# Patient Record
Sex: Male | Born: 1960
Health system: Southern US, Community
[De-identification: ages and names within clinical notes are randomized; demographics above are authoritative.]

## PROBLEM LIST (undated history)

## (undated) DIAGNOSIS — I10 Essential (primary) hypertension: Secondary | ICD-10-CM

## (undated) DIAGNOSIS — E079 Disorder of thyroid, unspecified: Secondary | ICD-10-CM

## (undated) DIAGNOSIS — N400 Enlarged prostate without lower urinary tract symptoms: Secondary | ICD-10-CM

## (undated) DIAGNOSIS — F329 Major depressive disorder, single episode, unspecified: Secondary | ICD-10-CM

## (undated) DIAGNOSIS — F32A Depression, unspecified: Secondary | ICD-10-CM

## (undated) DIAGNOSIS — E785 Hyperlipidemia, unspecified: Secondary | ICD-10-CM

## (undated) DIAGNOSIS — K7460 Unspecified cirrhosis of liver: Secondary | ICD-10-CM

## (undated) DIAGNOSIS — G4733 Obstructive sleep apnea (adult) (pediatric): Secondary | ICD-10-CM

## (undated) HISTORY — PX: LUMBAR LAMINECTOMY: SHX95

## (undated) HISTORY — PX: CERVICAL FUSION: SHX112

## (undated) HISTORY — PX: CYSTOSCOPY WITH INSERTION OF UROLIFT: SHX6678

## (undated) HISTORY — PX: OTHER SURGICAL HISTORY: SHX169

## (undated) HISTORY — DX: Depression, unspecified: F32.A

## (undated) HISTORY — DX: Obstructive sleep apnea (adult) (pediatric): G47.33

## (undated) HISTORY — DX: Benign prostatic hyperplasia without lower urinary tract symptoms: N40.0

## (undated) HISTORY — DX: Hyperlipidemia, unspecified: E78.5

## (undated) HISTORY — DX: Major depressive disorder, single episode, unspecified: F32.9

## (undated) HISTORY — DX: Disorder of thyroid, unspecified: E07.9

## (undated) HISTORY — DX: Essential (primary) hypertension: I10

## (undated) HISTORY — DX: Unspecified cirrhosis of liver: K74.60

---

## 2017-07-24 ENCOUNTER — Encounter: Payer: Self-pay | Admitting: *Deleted

## 2017-07-24 DIAGNOSIS — F329 Major depressive disorder, single episode, unspecified: Secondary | ICD-10-CM | POA: Insufficient documentation

## 2017-07-24 DIAGNOSIS — I10 Essential (primary) hypertension: Secondary | ICD-10-CM | POA: Insufficient documentation

## 2017-07-24 DIAGNOSIS — K7460 Unspecified cirrhosis of liver: Secondary | ICD-10-CM | POA: Insufficient documentation

## 2017-07-24 DIAGNOSIS — G4733 Obstructive sleep apnea (adult) (pediatric): Secondary | ICD-10-CM | POA: Insufficient documentation

## 2017-07-24 DIAGNOSIS — E785 Hyperlipidemia, unspecified: Secondary | ICD-10-CM | POA: Insufficient documentation

## 2017-07-24 DIAGNOSIS — N400 Enlarged prostate without lower urinary tract symptoms: Secondary | ICD-10-CM | POA: Insufficient documentation

## 2017-07-24 DIAGNOSIS — F32A Depression, unspecified: Secondary | ICD-10-CM | POA: Insufficient documentation

## 2017-07-25 ENCOUNTER — Encounter: Payer: Self-pay | Admitting: Surgery

## 2017-07-25 ENCOUNTER — Encounter: Payer: Self-pay | Admitting: *Deleted

## 2017-07-25 ENCOUNTER — Institutional Professional Consult (permissible substitution) (INDEPENDENT_AMBULATORY_CARE_PROVIDER_SITE_OTHER): Payer: Medicare HMO | Admitting: Surgery

## 2017-07-25 VITALS — BP 126/83 | HR 57 | Ht 69.0 in | Wt 246.0 lb

## 2017-07-25 DIAGNOSIS — I712 Thoracic aortic aneurysm, without rupture, unspecified: Secondary | ICD-10-CM | POA: Insufficient documentation

## 2017-07-27 ENCOUNTER — Encounter: Payer: Self-pay | Admitting: Surgery

## 2017-07-27 NOTE — Progress Notes (Signed)
Cardiothoracic Surgery Consultation   PCP is Slatosky, Marshall Cork., MD Referring Provider is Enid Skeens., MD  Chief Complaint  Patient presents with  . Thoracic Aortic Aneurysm        HPI:  The patient is a 56 year old gentleman with hypertension and hyperlipidemia, OSA, and cirrhosis of the liver who had a CT scan of the abdomen at [facility] on 06/18/2017 reportedly for evaluation of his liver. This showed an incidental 4.2 cm fusiform ascending aortic aneurysm.  Past Medical History:  Diagnosis Date  . BPH (benign prostatic hyperplasia)   . Cirrhosis of liver ([place])   . Depression   . Hyperlipidemia   . Hypertension   . OSA (obstructive sleep apnea)   . Thyroid disease    hypothyroidism    Past Surgical History:  Procedure Laterality Date  . arthroscopy left knee    . cataract phaco bilaterally    . CERVICAL FUSION     x 2  . CYSTOSCOPY WITH INSERTION OF UROLIFT    . LUMBAR LAMINECTOMY     with hardware    Family History  Problem Relation Age of Onset  . Cancer Father        LIVER and LUNG  . Hyperlipidemia Father   . Thyroid disease Father   . Alzheimer's disease Paternal Grandfather     Social History Social History  Substance Use Topics  . Smoking status: Never Smoker  . Smokeless tobacco: Never Used  . Alcohol use No     Comment: HX OF ETOH ABUSE    Current Outpatient Prescriptions  Medication Sig Dispense Refill  . amLODipine-benazepril (LOTREL) 5-20 MG capsule daily.    Marland Kitchen doxazosin (CARDURA) 8 MG tablet daily.    Marland Kitchen levothyroxine (SYNTHROID, LEVOTHROID) 112 MCG tablet Take 112 mcg by mouth daily.  1  . lovastatin (MEVACOR) 20 MG tablet daily.    Marland Kitchen valproic acid (DEPAKENE) 250 MG capsule Take 250 mg by mouth 4 (four) times daily.    . Venlafaxine HCl 150 MG TB24 daily.     No current facility-administered medications for this visit.     No Known Allergies  Review of Systems  Constitutional: Positive for fatigue.  HENT:    Dentures  Eyes: Negative.   Respiratory: Positive for apnea and shortness of breath.        Uses CPAP  Cardiovascular: Positive for chest pain and leg swelling.  Gastrointestinal: Positive for constipation.  Genitourinary:       BPH  Musculoskeletal: Positive for arthralgias, gait problem, joint swelling and myalgias.  Skin: Positive for rash.  Neurological: Positive for tremors and headaches.       Chronic pain, memory problems.  Hematological: Negative.   Psychiatric/Behavioral: The patient is nervous/anxious.        Depression    BP 126/83   Pulse (!) 57   Ht 5\' 9"  (1.753 m)   Wt 246 lb (111.6 kg)   SpO2 96%   BMI 36.33 kg/m  Physical Exam  Constitutional: He is oriented to person, place, and time. He appears well-developed and well-nourished. No distress.  HENT:  Head: Normocephalic and atraumatic.  Mouth/Throat: Oropharynx is clear and moist.  Eyes: Pupils are equal, round, and reactive to light. EOM are normal.  Neck: Normal range of motion. Neck supple. No JVD present.  Cardiovascular: Normal rate, regular rhythm, normal heart sounds and intact distal pulses.   No murmur heard. Pulmonary/Chest: Effort normal and breath sounds normal. No respiratory  distress.  Abdominal: Soft. Bowel sounds are normal. He exhibits no distension and no mass. There is no tenderness.  Musculoskeletal: Normal range of motion. He exhibits no edema.  Lymphadenopathy:    He has no cervical adenopathy.  Neurological: He is alert and oriented to person, place, and time. He has normal strength. No cranial nerve deficit or sensory deficit.  Skin: Skin is warm and dry.  Psychiatric: He has a normal mood and affect.     Diagnostic Tests:  CT scan of the abdomen from [facility] on 06/18/2017 reviewed on PACS.  Impression:  He has a 4.2 cm fusiform ascending aortic aneurysm that was noted incidentally on this CT scan. There is no sign of aortic dissection. The scan was of the abdomen  but visualized the proximal ascending aorta and mid to distal descending aorta. The aortic arch was not visible. This does not require any treatment at this time but he should have a follow up CTA of the chest in one year. I reviewed the CT images with him and his wife and answered their questions. I stressed the importance of good blood pressure control. The only activity I cautioned him against was heavy lifting of approximately 30 lbs or more which can cause sudden severe high blood pressure in some individuals.   Plan:  I will see him back in one year with a CTA of the chest.   I spent 30 minutes performing this consultation and > 50% of this time was spent face to face counseling and coordinating the care of this patient's aortic aneurysm.   Gaye Pollack, MD [facility] [phone]

## 2018-06-10 DIAGNOSIS — Z01818 Encounter for other preprocedural examination: Secondary | ICD-10-CM

## 2018-07-03 ENCOUNTER — Other Ambulatory Visit: Payer: Self-pay | Admitting: Surgery

## 2018-07-03 DIAGNOSIS — I712 Thoracic aortic aneurysm, without rupture, unspecified: Secondary | ICD-10-CM

## 2018-07-31 ENCOUNTER — Other Ambulatory Visit: Payer: Medicare HMO

## 2018-07-31 ENCOUNTER — Ambulatory Visit: Payer: Medicare HMO | Admitting: Surgery

## 2018-10-25 DIAGNOSIS — M50322 Other cervical disc degeneration at C5-C6 level: Secondary | ICD-10-CM | POA: Diagnosis not present

## 2018-10-25 DIAGNOSIS — S29019A Strain of muscle and tendon of unspecified wall of thorax, initial encounter: Secondary | ICD-10-CM | POA: Diagnosis not present

## 2018-10-25 DIAGNOSIS — M9902 Segmental and somatic dysfunction of thoracic region: Secondary | ICD-10-CM | POA: Diagnosis not present

## 2018-10-25 DIAGNOSIS — M4003 Postural kyphosis, cervicothoracic region: Secondary | ICD-10-CM | POA: Diagnosis not present

## 2018-10-25 DIAGNOSIS — M9901 Segmental and somatic dysfunction of cervical region: Secondary | ICD-10-CM | POA: Diagnosis not present

## 2018-10-25 DIAGNOSIS — M545 Low back pain: Secondary | ICD-10-CM | POA: Diagnosis not present

## 2018-10-25 DIAGNOSIS — M5386 Other specified dorsopathies, lumbar region: Secondary | ICD-10-CM | POA: Diagnosis not present

## 2018-10-25 DIAGNOSIS — M9903 Segmental and somatic dysfunction of lumbar region: Secondary | ICD-10-CM | POA: Diagnosis not present

## 2018-10-25 DIAGNOSIS — M546 Pain in thoracic spine: Secondary | ICD-10-CM | POA: Diagnosis not present

## 2018-10-25 DIAGNOSIS — M542 Cervicalgia: Secondary | ICD-10-CM | POA: Diagnosis not present

## 2018-10-30 DIAGNOSIS — M546 Pain in thoracic spine: Secondary | ICD-10-CM | POA: Diagnosis not present

## 2018-10-30 DIAGNOSIS — M545 Low back pain: Secondary | ICD-10-CM | POA: Diagnosis not present

## 2018-10-30 DIAGNOSIS — M4003 Postural kyphosis, cervicothoracic region: Secondary | ICD-10-CM | POA: Diagnosis not present

## 2018-10-30 DIAGNOSIS — M542 Cervicalgia: Secondary | ICD-10-CM | POA: Diagnosis not present

## 2018-10-30 DIAGNOSIS — M9903 Segmental and somatic dysfunction of lumbar region: Secondary | ICD-10-CM | POA: Diagnosis not present

## 2018-10-30 DIAGNOSIS — M9901 Segmental and somatic dysfunction of cervical region: Secondary | ICD-10-CM | POA: Diagnosis not present

## 2018-10-30 DIAGNOSIS — M50322 Other cervical disc degeneration at C5-C6 level: Secondary | ICD-10-CM | POA: Diagnosis not present

## 2018-10-30 DIAGNOSIS — M5386 Other specified dorsopathies, lumbar region: Secondary | ICD-10-CM | POA: Diagnosis not present

## 2018-10-30 DIAGNOSIS — M9902 Segmental and somatic dysfunction of thoracic region: Secondary | ICD-10-CM | POA: Diagnosis not present

## 2018-10-30 DIAGNOSIS — S29019A Strain of muscle and tendon of unspecified wall of thorax, initial encounter: Secondary | ICD-10-CM | POA: Diagnosis not present

## 2018-11-01 DIAGNOSIS — M50322 Other cervical disc degeneration at C5-C6 level: Secondary | ICD-10-CM | POA: Diagnosis not present

## 2018-11-01 DIAGNOSIS — M546 Pain in thoracic spine: Secondary | ICD-10-CM | POA: Diagnosis not present

## 2018-11-01 DIAGNOSIS — M545 Low back pain: Secondary | ICD-10-CM | POA: Diagnosis not present

## 2018-11-01 DIAGNOSIS — M4003 Postural kyphosis, cervicothoracic region: Secondary | ICD-10-CM | POA: Diagnosis not present

## 2018-11-01 DIAGNOSIS — M9903 Segmental and somatic dysfunction of lumbar region: Secondary | ICD-10-CM | POA: Diagnosis not present

## 2018-11-01 DIAGNOSIS — M9901 Segmental and somatic dysfunction of cervical region: Secondary | ICD-10-CM | POA: Diagnosis not present

## 2018-11-01 DIAGNOSIS — M5386 Other specified dorsopathies, lumbar region: Secondary | ICD-10-CM | POA: Diagnosis not present

## 2018-11-01 DIAGNOSIS — M542 Cervicalgia: Secondary | ICD-10-CM | POA: Diagnosis not present

## 2018-11-01 DIAGNOSIS — M9902 Segmental and somatic dysfunction of thoracic region: Secondary | ICD-10-CM | POA: Diagnosis not present

## 2018-11-01 DIAGNOSIS — S29019A Strain of muscle and tendon of unspecified wall of thorax, initial encounter: Secondary | ICD-10-CM | POA: Diagnosis not present

## 2018-11-06 DIAGNOSIS — S29019A Strain of muscle and tendon of unspecified wall of thorax, initial encounter: Secondary | ICD-10-CM | POA: Diagnosis not present

## 2018-11-06 DIAGNOSIS — M546 Pain in thoracic spine: Secondary | ICD-10-CM | POA: Diagnosis not present

## 2018-11-06 DIAGNOSIS — M5386 Other specified dorsopathies, lumbar region: Secondary | ICD-10-CM | POA: Diagnosis not present

## 2018-11-06 DIAGNOSIS — M9903 Segmental and somatic dysfunction of lumbar region: Secondary | ICD-10-CM | POA: Diagnosis not present

## 2018-11-06 DIAGNOSIS — M9901 Segmental and somatic dysfunction of cervical region: Secondary | ICD-10-CM | POA: Diagnosis not present

## 2018-11-06 DIAGNOSIS — M542 Cervicalgia: Secondary | ICD-10-CM | POA: Diagnosis not present

## 2018-11-06 DIAGNOSIS — M545 Low back pain: Secondary | ICD-10-CM | POA: Diagnosis not present

## 2018-11-06 DIAGNOSIS — M9902 Segmental and somatic dysfunction of thoracic region: Secondary | ICD-10-CM | POA: Diagnosis not present

## 2018-11-06 DIAGNOSIS — M50322 Other cervical disc degeneration at C5-C6 level: Secondary | ICD-10-CM | POA: Diagnosis not present

## 2018-11-06 DIAGNOSIS — M4003 Postural kyphosis, cervicothoracic region: Secondary | ICD-10-CM | POA: Diagnosis not present

## 2018-11-08 DIAGNOSIS — M546 Pain in thoracic spine: Secondary | ICD-10-CM | POA: Diagnosis not present

## 2018-11-08 DIAGNOSIS — M9903 Segmental and somatic dysfunction of lumbar region: Secondary | ICD-10-CM | POA: Diagnosis not present

## 2018-11-08 DIAGNOSIS — M4003 Postural kyphosis, cervicothoracic region: Secondary | ICD-10-CM | POA: Diagnosis not present

## 2018-11-08 DIAGNOSIS — M9902 Segmental and somatic dysfunction of thoracic region: Secondary | ICD-10-CM | POA: Diagnosis not present

## 2018-11-08 DIAGNOSIS — M9901 Segmental and somatic dysfunction of cervical region: Secondary | ICD-10-CM | POA: Diagnosis not present

## 2018-11-08 DIAGNOSIS — M50322 Other cervical disc degeneration at C5-C6 level: Secondary | ICD-10-CM | POA: Diagnosis not present

## 2018-11-08 DIAGNOSIS — M5386 Other specified dorsopathies, lumbar region: Secondary | ICD-10-CM | POA: Diagnosis not present

## 2018-11-08 DIAGNOSIS — M545 Low back pain: Secondary | ICD-10-CM | POA: Diagnosis not present

## 2018-11-08 DIAGNOSIS — S29019A Strain of muscle and tendon of unspecified wall of thorax, initial encounter: Secondary | ICD-10-CM | POA: Diagnosis not present

## 2018-11-08 DIAGNOSIS — M542 Cervicalgia: Secondary | ICD-10-CM | POA: Diagnosis not present

## 2018-11-13 DIAGNOSIS — M542 Cervicalgia: Secondary | ICD-10-CM | POA: Diagnosis not present

## 2018-11-13 DIAGNOSIS — M50322 Other cervical disc degeneration at C5-C6 level: Secondary | ICD-10-CM | POA: Diagnosis not present

## 2018-11-13 DIAGNOSIS — M9901 Segmental and somatic dysfunction of cervical region: Secondary | ICD-10-CM | POA: Diagnosis not present

## 2018-11-13 DIAGNOSIS — M545 Low back pain: Secondary | ICD-10-CM | POA: Diagnosis not present

## 2018-11-13 DIAGNOSIS — M4003 Postural kyphosis, cervicothoracic region: Secondary | ICD-10-CM | POA: Diagnosis not present

## 2018-11-13 DIAGNOSIS — M9903 Segmental and somatic dysfunction of lumbar region: Secondary | ICD-10-CM | POA: Diagnosis not present

## 2018-11-13 DIAGNOSIS — M546 Pain in thoracic spine: Secondary | ICD-10-CM | POA: Diagnosis not present

## 2018-11-13 DIAGNOSIS — M5386 Other specified dorsopathies, lumbar region: Secondary | ICD-10-CM | POA: Diagnosis not present

## 2018-11-13 DIAGNOSIS — M9902 Segmental and somatic dysfunction of thoracic region: Secondary | ICD-10-CM | POA: Diagnosis not present

## 2018-11-13 DIAGNOSIS — S29019A Strain of muscle and tendon of unspecified wall of thorax, initial encounter: Secondary | ICD-10-CM | POA: Diagnosis not present

## 2018-11-20 DIAGNOSIS — M9901 Segmental and somatic dysfunction of cervical region: Secondary | ICD-10-CM | POA: Diagnosis not present

## 2018-11-20 DIAGNOSIS — M542 Cervicalgia: Secondary | ICD-10-CM | POA: Diagnosis not present

## 2018-11-20 DIAGNOSIS — M50322 Other cervical disc degeneration at C5-C6 level: Secondary | ICD-10-CM | POA: Diagnosis not present

## 2018-11-20 DIAGNOSIS — M4003 Postural kyphosis, cervicothoracic region: Secondary | ICD-10-CM | POA: Diagnosis not present

## 2018-11-20 DIAGNOSIS — S29019A Strain of muscle and tendon of unspecified wall of thorax, initial encounter: Secondary | ICD-10-CM | POA: Diagnosis not present

## 2018-11-20 DIAGNOSIS — M9903 Segmental and somatic dysfunction of lumbar region: Secondary | ICD-10-CM | POA: Diagnosis not present

## 2018-11-20 DIAGNOSIS — M9902 Segmental and somatic dysfunction of thoracic region: Secondary | ICD-10-CM | POA: Diagnosis not present

## 2018-11-20 DIAGNOSIS — M5386 Other specified dorsopathies, lumbar region: Secondary | ICD-10-CM | POA: Diagnosis not present

## 2018-11-20 DIAGNOSIS — M546 Pain in thoracic spine: Secondary | ICD-10-CM | POA: Diagnosis not present

## 2018-11-20 DIAGNOSIS — M545 Low back pain: Secondary | ICD-10-CM | POA: Diagnosis not present

## 2018-11-26 DIAGNOSIS — L219 Seborrheic dermatitis, unspecified: Secondary | ICD-10-CM | POA: Diagnosis not present

## 2018-11-26 DIAGNOSIS — L570 Actinic keratosis: Secondary | ICD-10-CM | POA: Diagnosis not present

## 2018-11-28 DIAGNOSIS — Z96652 Presence of left artificial knee joint: Secondary | ICD-10-CM | POA: Diagnosis not present

## 2018-11-28 DIAGNOSIS — M1712 Unilateral primary osteoarthritis, left knee: Secondary | ICD-10-CM | POA: Diagnosis not present

## 2018-12-05 DIAGNOSIS — S29019A Strain of muscle and tendon of unspecified wall of thorax, initial encounter: Secondary | ICD-10-CM | POA: Diagnosis not present

## 2018-12-05 DIAGNOSIS — M9901 Segmental and somatic dysfunction of cervical region: Secondary | ICD-10-CM | POA: Diagnosis not present

## 2018-12-05 DIAGNOSIS — M546 Pain in thoracic spine: Secondary | ICD-10-CM | POA: Diagnosis not present

## 2018-12-05 DIAGNOSIS — M50322 Other cervical disc degeneration at C5-C6 level: Secondary | ICD-10-CM | POA: Diagnosis not present

## 2018-12-05 DIAGNOSIS — M545 Low back pain: Secondary | ICD-10-CM | POA: Diagnosis not present

## 2018-12-05 DIAGNOSIS — M4003 Postural kyphosis, cervicothoracic region: Secondary | ICD-10-CM | POA: Diagnosis not present

## 2018-12-05 DIAGNOSIS — M9903 Segmental and somatic dysfunction of lumbar region: Secondary | ICD-10-CM | POA: Diagnosis not present

## 2018-12-05 DIAGNOSIS — M9902 Segmental and somatic dysfunction of thoracic region: Secondary | ICD-10-CM | POA: Diagnosis not present

## 2018-12-05 DIAGNOSIS — M542 Cervicalgia: Secondary | ICD-10-CM | POA: Diagnosis not present

## 2018-12-05 DIAGNOSIS — M5386 Other specified dorsopathies, lumbar region: Secondary | ICD-10-CM | POA: Diagnosis not present

## 2018-12-11 DIAGNOSIS — E038 Other specified hypothyroidism: Secondary | ICD-10-CM | POA: Diagnosis not present

## 2018-12-19 DIAGNOSIS — M542 Cervicalgia: Secondary | ICD-10-CM | POA: Diagnosis not present

## 2018-12-19 DIAGNOSIS — M9901 Segmental and somatic dysfunction of cervical region: Secondary | ICD-10-CM | POA: Diagnosis not present

## 2018-12-19 DIAGNOSIS — M9902 Segmental and somatic dysfunction of thoracic region: Secondary | ICD-10-CM | POA: Diagnosis not present

## 2018-12-19 DIAGNOSIS — M5386 Other specified dorsopathies, lumbar region: Secondary | ICD-10-CM | POA: Diagnosis not present

## 2018-12-19 DIAGNOSIS — M4003 Postural kyphosis, cervicothoracic region: Secondary | ICD-10-CM | POA: Diagnosis not present

## 2018-12-19 DIAGNOSIS — M9903 Segmental and somatic dysfunction of lumbar region: Secondary | ICD-10-CM | POA: Diagnosis not present

## 2018-12-19 DIAGNOSIS — S29019A Strain of muscle and tendon of unspecified wall of thorax, initial encounter: Secondary | ICD-10-CM | POA: Diagnosis not present

## 2018-12-19 DIAGNOSIS — M546 Pain in thoracic spine: Secondary | ICD-10-CM | POA: Diagnosis not present

## 2018-12-19 DIAGNOSIS — M545 Low back pain: Secondary | ICD-10-CM | POA: Diagnosis not present

## 2018-12-19 DIAGNOSIS — M50322 Other cervical disc degeneration at C5-C6 level: Secondary | ICD-10-CM | POA: Diagnosis not present

## 2018-12-30 DIAGNOSIS — R69 Illness, unspecified: Secondary | ICD-10-CM | POA: Diagnosis not present

## 2019-01-02 DIAGNOSIS — M545 Low back pain: Secondary | ICD-10-CM | POA: Diagnosis not present

## 2019-01-02 DIAGNOSIS — M542 Cervicalgia: Secondary | ICD-10-CM | POA: Diagnosis not present

## 2019-01-02 DIAGNOSIS — M546 Pain in thoracic spine: Secondary | ICD-10-CM | POA: Diagnosis not present

## 2019-01-02 DIAGNOSIS — M9901 Segmental and somatic dysfunction of cervical region: Secondary | ICD-10-CM | POA: Diagnosis not present

## 2019-01-02 DIAGNOSIS — M9903 Segmental and somatic dysfunction of lumbar region: Secondary | ICD-10-CM | POA: Diagnosis not present

## 2019-01-02 DIAGNOSIS — M4003 Postural kyphosis, cervicothoracic region: Secondary | ICD-10-CM | POA: Diagnosis not present

## 2019-01-02 DIAGNOSIS — S29019A Strain of muscle and tendon of unspecified wall of thorax, initial encounter: Secondary | ICD-10-CM | POA: Diagnosis not present

## 2019-01-02 DIAGNOSIS — M5386 Other specified dorsopathies, lumbar region: Secondary | ICD-10-CM | POA: Diagnosis not present

## 2019-01-02 DIAGNOSIS — M50322 Other cervical disc degeneration at C5-C6 level: Secondary | ICD-10-CM | POA: Diagnosis not present

## 2019-01-02 DIAGNOSIS — M9902 Segmental and somatic dysfunction of thoracic region: Secondary | ICD-10-CM | POA: Diagnosis not present

## 2019-01-06 DIAGNOSIS — G4733 Obstructive sleep apnea (adult) (pediatric): Secondary | ICD-10-CM | POA: Diagnosis not present

## 2019-01-06 DIAGNOSIS — M542 Cervicalgia: Secondary | ICD-10-CM | POA: Diagnosis not present

## 2019-01-06 DIAGNOSIS — M47816 Spondylosis without myelopathy or radiculopathy, lumbar region: Secondary | ICD-10-CM | POA: Diagnosis not present

## 2019-01-06 DIAGNOSIS — M545 Low back pain: Secondary | ICD-10-CM | POA: Diagnosis not present

## 2019-01-06 DIAGNOSIS — M5106 Intervertebral disc disorders with myelopathy, lumbar region: Secondary | ICD-10-CM | POA: Diagnosis not present

## 2019-01-06 DIAGNOSIS — M4322 Fusion of spine, cervical region: Secondary | ICD-10-CM | POA: Diagnosis not present

## 2019-01-06 DIAGNOSIS — M791 Myalgia, unspecified site: Secondary | ICD-10-CM | POA: Diagnosis not present

## 2019-01-15 DIAGNOSIS — M50322 Other cervical disc degeneration at C5-C6 level: Secondary | ICD-10-CM | POA: Diagnosis not present

## 2019-01-15 DIAGNOSIS — M4003 Postural kyphosis, cervicothoracic region: Secondary | ICD-10-CM | POA: Diagnosis not present

## 2019-01-15 DIAGNOSIS — M9904 Segmental and somatic dysfunction of sacral region: Secondary | ICD-10-CM | POA: Diagnosis not present

## 2019-01-15 DIAGNOSIS — M9901 Segmental and somatic dysfunction of cervical region: Secondary | ICD-10-CM | POA: Diagnosis not present

## 2019-01-15 DIAGNOSIS — M545 Low back pain: Secondary | ICD-10-CM | POA: Diagnosis not present

## 2019-01-15 DIAGNOSIS — M5386 Other specified dorsopathies, lumbar region: Secondary | ICD-10-CM | POA: Diagnosis not present

## 2019-01-15 DIAGNOSIS — M9902 Segmental and somatic dysfunction of thoracic region: Secondary | ICD-10-CM | POA: Diagnosis not present

## 2019-02-24 DIAGNOSIS — R69 Illness, unspecified: Secondary | ICD-10-CM | POA: Diagnosis not present

## 2019-02-26 DIAGNOSIS — M1711 Unilateral primary osteoarthritis, right knee: Secondary | ICD-10-CM | POA: Diagnosis not present

## 2019-03-13 DIAGNOSIS — E038 Other specified hypothyroidism: Secondary | ICD-10-CM | POA: Diagnosis not present

## 2019-03-18 DIAGNOSIS — G4733 Obstructive sleep apnea (adult) (pediatric): Secondary | ICD-10-CM | POA: Diagnosis not present

## 2019-03-18 DIAGNOSIS — I77810 Thoracic aortic ectasia: Secondary | ICD-10-CM | POA: Diagnosis not present

## 2019-03-18 DIAGNOSIS — I10 Essential (primary) hypertension: Secondary | ICD-10-CM | POA: Diagnosis not present

## 2019-03-18 DIAGNOSIS — E782 Mixed hyperlipidemia: Secondary | ICD-10-CM | POA: Diagnosis not present

## 2019-03-18 DIAGNOSIS — Z9989 Dependence on other enabling machines and devices: Secondary | ICD-10-CM | POA: Diagnosis not present

## 2019-04-07 DIAGNOSIS — K760 Fatty (change of) liver, not elsewhere classified: Secondary | ICD-10-CM | POA: Diagnosis not present

## 2019-04-07 DIAGNOSIS — K5900 Constipation, unspecified: Secondary | ICD-10-CM | POA: Diagnosis not present

## 2019-04-15 DIAGNOSIS — L820 Inflamed seborrheic keratosis: Secondary | ICD-10-CM | POA: Diagnosis not present

## 2019-04-15 DIAGNOSIS — L219 Seborrheic dermatitis, unspecified: Secondary | ICD-10-CM | POA: Diagnosis not present

## 2019-04-21 DIAGNOSIS — M7918 Myalgia, other site: Secondary | ICD-10-CM | POA: Diagnosis not present

## 2019-04-21 DIAGNOSIS — M542 Cervicalgia: Secondary | ICD-10-CM | POA: Diagnosis not present

## 2019-04-21 DIAGNOSIS — M4322 Fusion of spine, cervical region: Secondary | ICD-10-CM | POA: Diagnosis not present

## 2019-05-22 DIAGNOSIS — M7918 Myalgia, other site: Secondary | ICD-10-CM | POA: Diagnosis not present

## 2019-05-22 DIAGNOSIS — M542 Cervicalgia: Secondary | ICD-10-CM | POA: Diagnosis not present

## 2019-05-22 DIAGNOSIS — M4322 Fusion of spine, cervical region: Secondary | ICD-10-CM | POA: Diagnosis not present

## 2019-07-09 DIAGNOSIS — R1310 Dysphagia, unspecified: Secondary | ICD-10-CM | POA: Diagnosis not present

## 2019-07-09 DIAGNOSIS — K760 Fatty (change of) liver, not elsewhere classified: Secondary | ICD-10-CM | POA: Diagnosis not present

## 2019-07-23 DIAGNOSIS — M4322 Fusion of spine, cervical region: Secondary | ICD-10-CM | POA: Diagnosis not present

## 2019-07-23 DIAGNOSIS — M542 Cervicalgia: Secondary | ICD-10-CM | POA: Diagnosis not present

## 2019-07-23 DIAGNOSIS — M7918 Myalgia, other site: Secondary | ICD-10-CM | POA: Diagnosis not present

## 2019-07-31 DIAGNOSIS — I10 Essential (primary) hypertension: Secondary | ICD-10-CM | POA: Diagnosis not present

## 2019-07-31 DIAGNOSIS — R1310 Dysphagia, unspecified: Secondary | ICD-10-CM | POA: Diagnosis not present

## 2019-07-31 DIAGNOSIS — K7460 Unspecified cirrhosis of liver: Secondary | ICD-10-CM | POA: Diagnosis not present

## 2019-07-31 DIAGNOSIS — R69 Illness, unspecified: Secondary | ICD-10-CM | POA: Diagnosis not present

## 2019-07-31 DIAGNOSIS — Z981 Arthrodesis status: Secondary | ICD-10-CM | POA: Diagnosis not present

## 2019-07-31 DIAGNOSIS — Z1211 Encounter for screening for malignant neoplasm of colon: Secondary | ICD-10-CM | POA: Diagnosis not present

## 2019-07-31 DIAGNOSIS — K222 Esophageal obstruction: Secondary | ICD-10-CM | POA: Diagnosis not present

## 2019-08-01 ENCOUNTER — Other Ambulatory Visit: Payer: Self-pay | Admitting: Rehabilitation

## 2019-08-01 ENCOUNTER — Telehealth: Payer: Self-pay | Admitting: Nurse Practitioner

## 2019-08-01 DIAGNOSIS — M542 Cervicalgia: Secondary | ICD-10-CM

## 2019-08-01 NOTE — Telephone Encounter (Signed)
Phone call to patient to verify medication list and allergies for myelogram procedure. Pt instructed to hold Effexor for 48hrs prior to myelogram appointment time. Pt verbalized understanding. Pre and post procedure instructions reviewed with pt. 

## 2019-08-08 ENCOUNTER — Ambulatory Visit
Admission: RE | Admit: 2019-08-08 | Discharge: 2019-08-08 | Disposition: A | Discharge status: Home / Self Care | Payer: Medicare HMO | Source: Ambulatory Visit | Attending: Rehabilitation | Admitting: Rehabilitation

## 2019-08-08 DIAGNOSIS — M8938 Hypertrophy of bone, other site: Secondary | ICD-10-CM | POA: Diagnosis not present

## 2019-08-08 DIAGNOSIS — M4322 Fusion of spine, cervical region: Secondary | ICD-10-CM | POA: Diagnosis not present

## 2019-08-08 DIAGNOSIS — M542 Cervicalgia: Secondary | ICD-10-CM

## 2019-08-08 MED ORDER — IOPAMIDOL (ISOVUE-M 300) INJECTION 61%
10.0000 mL | Freq: Once | INTRAMUSCULAR | Status: AC | PRN
  Administered 2019-08-08: 10 mL via INTRATHECAL

## 2019-08-08 MED ORDER — DIAZEPAM 5 MG PO TABS
10.0000 mg | ORAL_TABLET | Freq: Once | ORAL | Status: AC
  Administered 2019-08-08: 10 mg via ORAL

## 2019-08-08 MED ORDER — DIAZEPAM 5 MG PO TABS: 5.0000 mg | ORAL_TABLET | Freq: Once | ORAL | Status: DC

## 2019-08-08 NOTE — Discharge Instructions (Signed)
Myelogram Discharge Instructions  1. Go home and rest quietly for the next 24 hours.  It is important to lie flat for the next 24 hours.  Get up only to go to the restroom.  You may lie in the bed or on a couch on your back, your stomach, your left side or your right side.  You may have one pillow under your head.  You may have pillows between your knees while you are on your side or under your knees while you are on your back.  2. DO NOT drive today.  Recline the seat as far back as it will go, while still wearing your seat belt, on the way home.  3. You may get up to go to the bathroom as needed.  You may sit up for 10 minutes to eat.  You may resume your normal diet and medications unless otherwise indicated.  Drink lots of extra fluids today and tomorrow.  4. The incidence of headache, nausea, or vomiting is about 5% (one in 20 patients).  If you develop a headache, lie flat and drink plenty of fluids until the headache goes away.  Caffeinated beverages may be helpful.  If you develop severe nausea and vomiting or a headache that does not go away with flat bed rest, call [phone].  5. You may resume normal activities after your 24 hours of bed rest is over; however, do not exert yourself strongly or do any heavy lifting tomorrow. If when you get up you have a headache when standing, go back to bed and force fluids for another 24 hours.  6. Call your physician for a follow-up appointment.  The results of your myelogram will be sent directly to your physician by the following day.  7. If you have any questions or if complications develop after you arrive home, please call [phone].  Discharge instructions have been explained to the patient.  The patient, or the person responsible for the patient, fully understands these instructions.  YOU MAY RESTART YOUR [facility] TOMORROW 08/09/2019 AT 10:30AM.

## 2019-08-12 DIAGNOSIS — M47816 Spondylosis without myelopathy or radiculopathy, lumbar region: Secondary | ICD-10-CM | POA: Diagnosis not present

## 2019-08-12 DIAGNOSIS — M545 Low back pain: Secondary | ICD-10-CM | POA: Diagnosis not present

## 2019-08-12 DIAGNOSIS — G4733 Obstructive sleep apnea (adult) (pediatric): Secondary | ICD-10-CM | POA: Diagnosis not present

## 2019-08-20 DIAGNOSIS — M791 Myalgia, unspecified site: Secondary | ICD-10-CM | POA: Diagnosis not present

## 2019-08-20 DIAGNOSIS — M542 Cervicalgia: Secondary | ICD-10-CM | POA: Diagnosis not present

## 2019-08-20 DIAGNOSIS — M4322 Fusion of spine, cervical region: Secondary | ICD-10-CM | POA: Diagnosis not present

## 2019-08-26 DIAGNOSIS — I77810 Thoracic aortic ectasia: Secondary | ICD-10-CM | POA: Diagnosis not present

## 2019-09-01 DIAGNOSIS — M47812 Spondylosis without myelopathy or radiculopathy, cervical region: Secondary | ICD-10-CM | POA: Diagnosis not present

## 2019-09-03 DIAGNOSIS — Z96652 Presence of left artificial knee joint: Secondary | ICD-10-CM | POA: Diagnosis not present

## 2019-09-03 DIAGNOSIS — K760 Fatty (change of) liver, not elsewhere classified: Secondary | ICD-10-CM | POA: Diagnosis not present

## 2019-09-03 DIAGNOSIS — M1712 Unilateral primary osteoarthritis, left knee: Secondary | ICD-10-CM | POA: Diagnosis not present

## 2019-09-03 DIAGNOSIS — R69 Illness, unspecified: Secondary | ICD-10-CM | POA: Diagnosis not present

## 2019-09-09 DIAGNOSIS — L820 Inflamed seborrheic keratosis: Secondary | ICD-10-CM | POA: Diagnosis not present

## 2019-09-09 DIAGNOSIS — L578 Other skin changes due to chronic exposure to nonionizing radiation: Secondary | ICD-10-CM | POA: Diagnosis not present

## 2019-09-09 DIAGNOSIS — L570 Actinic keratosis: Secondary | ICD-10-CM | POA: Diagnosis not present

## 2019-09-10 DIAGNOSIS — M47812 Spondylosis without myelopathy or radiculopathy, cervical region: Secondary | ICD-10-CM | POA: Diagnosis not present

## 2019-09-24 DIAGNOSIS — Z79899 Other long term (current) drug therapy: Secondary | ICD-10-CM | POA: Diagnosis not present

## 2019-09-24 DIAGNOSIS — I10 Essential (primary) hypertension: Secondary | ICD-10-CM | POA: Diagnosis not present

## 2019-09-24 DIAGNOSIS — E7801 Familial hypercholesterolemia: Secondary | ICD-10-CM | POA: Diagnosis not present

## 2019-09-24 DIAGNOSIS — R69 Illness, unspecified: Secondary | ICD-10-CM | POA: Diagnosis not present

## 2019-09-24 DIAGNOSIS — E038 Other specified hypothyroidism: Secondary | ICD-10-CM | POA: Diagnosis not present

## 2019-10-22 DIAGNOSIS — M47812 Spondylosis without myelopathy or radiculopathy, cervical region: Secondary | ICD-10-CM | POA: Diagnosis not present

## 2019-10-22 DIAGNOSIS — M542 Cervicalgia: Secondary | ICD-10-CM | POA: Diagnosis not present

## 2019-11-17 DIAGNOSIS — M47812 Spondylosis without myelopathy or radiculopathy, cervical region: Secondary | ICD-10-CM | POA: Diagnosis not present

## 2019-11-17 DIAGNOSIS — M7918 Myalgia, other site: Secondary | ICD-10-CM | POA: Diagnosis not present

## 2019-12-02 DIAGNOSIS — E038 Other specified hypothyroidism: Secondary | ICD-10-CM | POA: Diagnosis not present

## 2019-12-23 DIAGNOSIS — L820 Inflamed seborrheic keratosis: Secondary | ICD-10-CM | POA: Diagnosis not present

## 2019-12-23 DIAGNOSIS — L578 Other skin changes due to chronic exposure to nonionizing radiation: Secondary | ICD-10-CM | POA: Diagnosis not present

## 2019-12-23 DIAGNOSIS — L814 Other melanin hyperpigmentation: Secondary | ICD-10-CM | POA: Diagnosis not present

## 2019-12-23 DIAGNOSIS — D485 Neoplasm of uncertain behavior of skin: Secondary | ICD-10-CM | POA: Diagnosis not present

## 2020-01-21 DIAGNOSIS — H2703 Aphakia, bilateral: Secondary | ICD-10-CM | POA: Diagnosis not present

## 2020-02-10 DIAGNOSIS — R69 Illness, unspecified: Secondary | ICD-10-CM | POA: Diagnosis not present

## 2020-02-24 DIAGNOSIS — Z125 Encounter for screening for malignant neoplasm of prostate: Secondary | ICD-10-CM | POA: Diagnosis not present

## 2020-02-24 DIAGNOSIS — G4733 Obstructive sleep apnea (adult) (pediatric): Secondary | ICD-10-CM | POA: Diagnosis not present

## 2020-02-24 DIAGNOSIS — R413 Other amnesia: Secondary | ICD-10-CM | POA: Diagnosis not present

## 2020-03-01 DIAGNOSIS — K760 Fatty (change of) liver, not elsewhere classified: Secondary | ICD-10-CM | POA: Diagnosis not present

## 2020-03-01 DIAGNOSIS — R69 Illness, unspecified: Secondary | ICD-10-CM | POA: Diagnosis not present

## 2020-03-02 DIAGNOSIS — R69 Illness, unspecified: Secondary | ICD-10-CM | POA: Diagnosis not present

## 2020-03-03 DIAGNOSIS — G4733 Obstructive sleep apnea (adult) (pediatric): Secondary | ICD-10-CM | POA: Diagnosis not present

## 2020-03-03 DIAGNOSIS — E039 Hypothyroidism, unspecified: Secondary | ICD-10-CM | POA: Diagnosis not present

## 2020-03-03 DIAGNOSIS — I10 Essential (primary) hypertension: Secondary | ICD-10-CM | POA: Diagnosis not present

## 2020-03-03 DIAGNOSIS — R69 Illness, unspecified: Secondary | ICD-10-CM | POA: Diagnosis not present

## 2020-03-08 DIAGNOSIS — E038 Other specified hypothyroidism: Secondary | ICD-10-CM | POA: Diagnosis not present

## 2020-03-18 DIAGNOSIS — I77810 Thoracic aortic ectasia: Secondary | ICD-10-CM | POA: Diagnosis not present

## 2020-03-18 DIAGNOSIS — I10 Essential (primary) hypertension: Secondary | ICD-10-CM | POA: Diagnosis not present

## 2020-03-18 DIAGNOSIS — I2510 Atherosclerotic heart disease of native coronary artery without angina pectoris: Secondary | ICD-10-CM | POA: Diagnosis not present

## 2020-03-18 DIAGNOSIS — G4733 Obstructive sleep apnea (adult) (pediatric): Secondary | ICD-10-CM | POA: Diagnosis not present

## 2020-03-18 DIAGNOSIS — E782 Mixed hyperlipidemia: Secondary | ICD-10-CM | POA: Diagnosis not present

## 2020-03-18 DIAGNOSIS — Z9989 Dependence on other enabling machines and devices: Secondary | ICD-10-CM | POA: Diagnosis not present

## 2020-04-24 IMAGING — CT CT CERVICAL SPINE W/ CM
2 series · 10 of 14 positions shown, 12 images · non-contrast
Comparison: none

CLINICAL DATA: Cervicalgia. Left sided neck pain extending into the
posterior aspect of the shoulder despite cervical spine fusion.
TECHNIQUE: Contiguous axial images were obtained through the cervical spine
after the intrathecal infusion of contrast. Coronal and sagittal
reconstructions were obtained of the axial image sets.

[Series 3: cspine soft (person_name) · axial · 0.33mm/px · z∈[-224,-100]mm · 5 of 94 slices shown]
[im 16/94  soft-tissue]
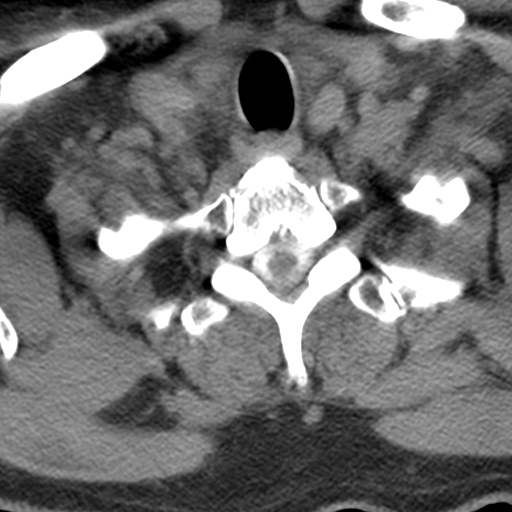
[im 32/94  soft-tissue]
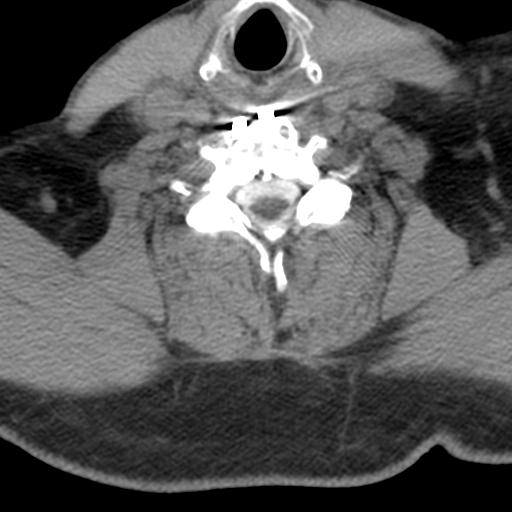
[im 47/94  soft-tissue]
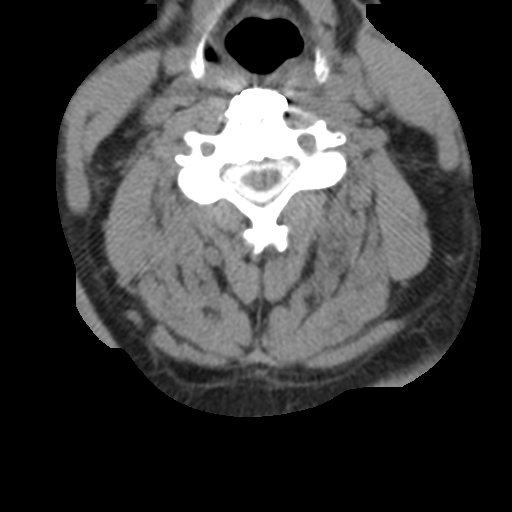
[im 63/94  soft-tissue]
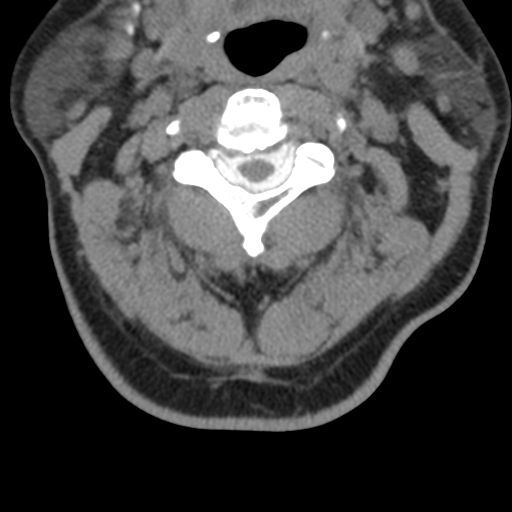
[im 78/94  soft-tissue]
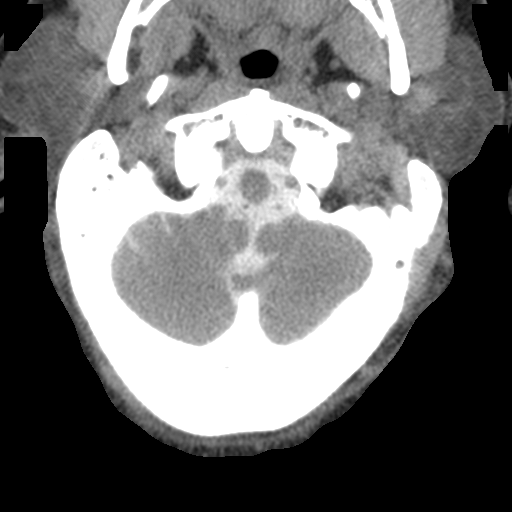

[Series 9: angled axial · axial · 0.23mm/px · z∈[-241,-122]mm · 5 of 94 slices shown, 7 images]
[im 16/94  soft-tissue]
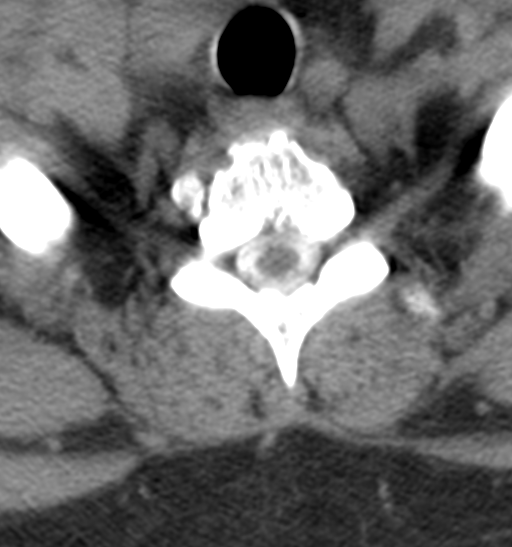
[im 16/94  bone]
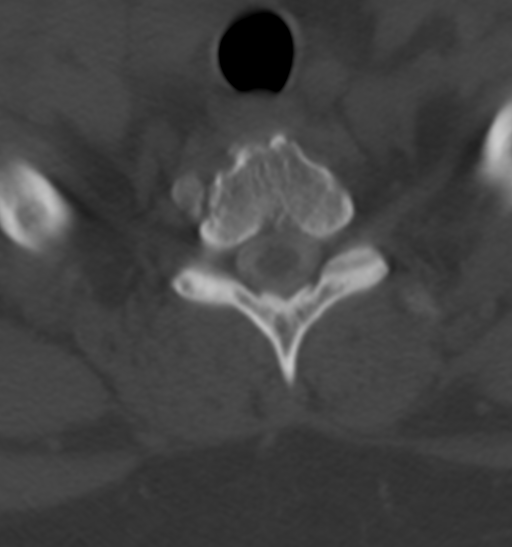
[im 32/94  bone]
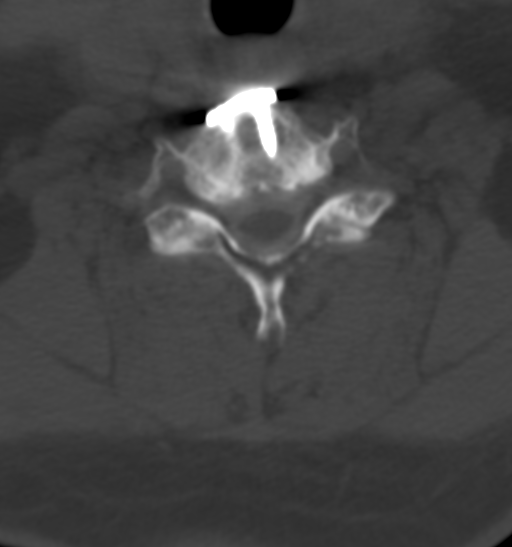
[im 47/94  bone]
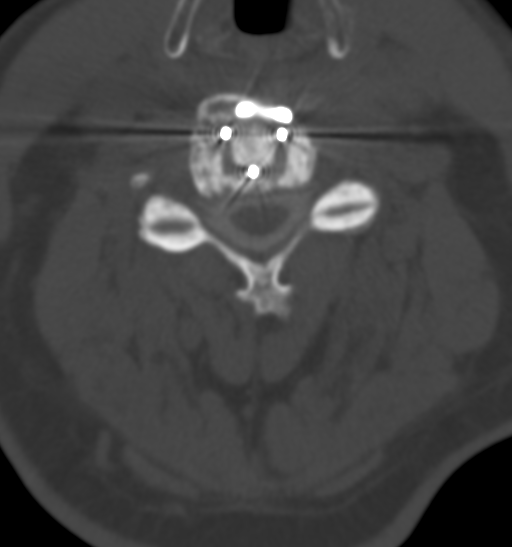
[im 63/94  bone]
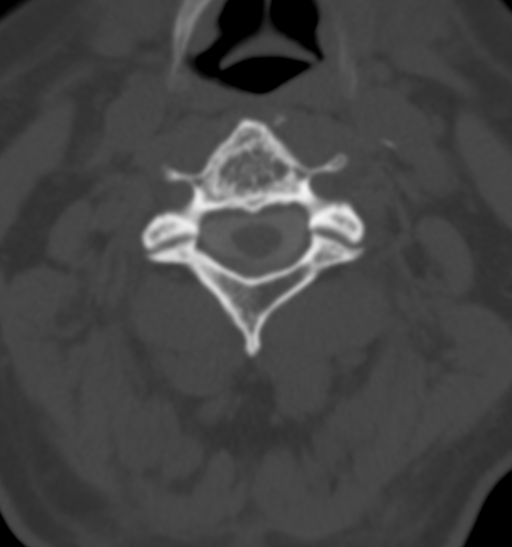
[im 78/94  soft-tissue]
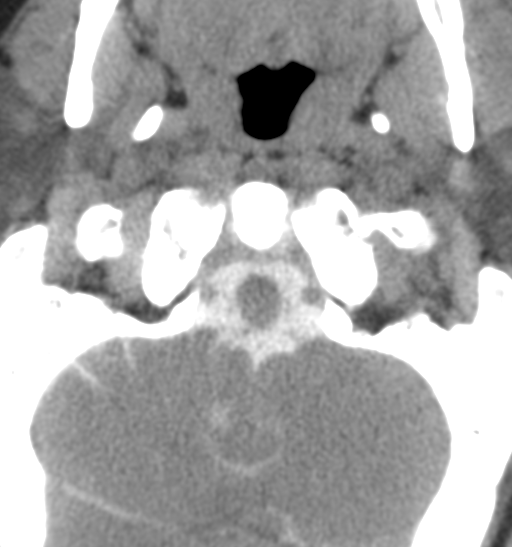
[im 78/94  bone]
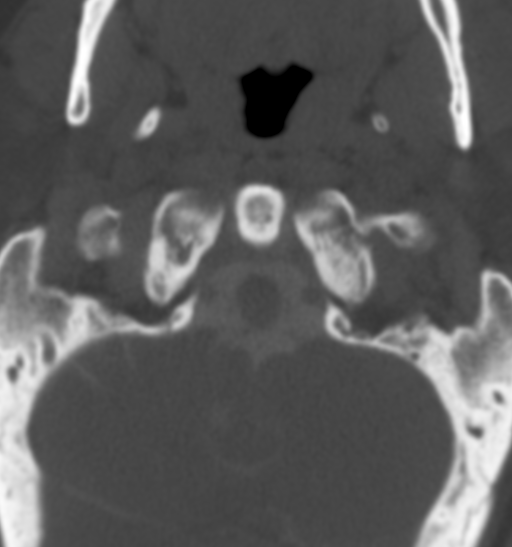

[10 of 14 positions shown; findings below may reference images not displayed]

EXAM:
CERVICAL MYELOGRAM

CT CERVICAL MYELOGRAM

FLUOROSCOPY TIME:  Radiation Exposure Index (as provided by the
fluoroscopic device): 276.27 uGy*m2

PROCEDURE:
LUMBAR PUNCTURE FOR CERVICAL MYELOGRAM

After thorough discussion of risks and benefits of the procedure
including bleeding, infection, injury to nerves, blood vessels,
adjacent structures as well as headache and CSF leak, written and
oral informed consent was obtained. Consent was obtained by Dr.
Golden Gines. We discussed the high likelihood of obtaining a
diagnostic study.

Patient was positioned prone on the fluoroscopy table. Local
anesthesia was provided with 1% lidocaine without epinephrine after
prepped and draped in the usual sterile fashion. Puncture was
performed at L2-3 using a 3 1/2 inch 22-gauge spinal needle via left
paramedian approach. Using a single pass through the dura, the
needle was placed within the thecal sac, with return of clear CSF.
10 mL of Isovue F-H44 was injected into the thecal sac, with normal
opacification of the nerve roots and cauda equina consistent with
free flow within the subarachnoid space. The patient was then moved
to the trendelenburg position and contrast flowed into the cervical
spine region.

I personally performed the lumbar puncture and administered the
intrathecal contrast. I also personally supervised acquisition of
the myelogram images.
FINDINGS: CERVICAL MYELOGRAM FINDINGS:

The cervical spine is fused anteriorly C3-C7. Hardware is intact. No
significant ventral stenosis is present. Foramina are not seen.

CT CERVICAL MYELOGRAM FINDINGS:

Cervical spine is imaged from the skull base through T1-2. Solid
fusion is present C3-7. Hardware is intact.

Craniocervical junction is within normal limits.

C2-3: A rightward disc osteophyte complex partially effaces the
ventral CSF. The foramina are patent bilaterally.

C3-4: Uncovertebral spurring contributes to mild foraminal narrowing
bilaterally. The central canal is patent.

C4-5: Solid fusion is present.  There is no significant stenosis.

C5-6: Asymmetric right-sided uncovertebral spurring is present. Mild
right foraminal narrowing is stable.

C6-7: Asymmetric right-sided uncovertebral spurring is stable.
Moderate right and mild left foraminal stenosis is present.

C7-T1: Asymmetric left-sided facet hypertrophy is again noted. Mild
left foraminal narrowing is present.
IMPRESSION: 1. Interval fusion at C3-4 and C4-5.
2. Mild residual foraminal narrowing bilaterally at C3-4.
3. No significant central canal stenosis at C3-4 or C4-5.
4. Stable fusion at C5-6 and C6-7 with residual foraminal narrowing
as described.
5. Asymmetric left-sided facet hypertrophy at C7-T1. Mild left
foraminal narrowing is present.

## 2020-04-24 IMAGING — XA DG MYELOGRAPHY LUMBAR INJ CERVICAL
12 series · 12 of 12 positions shown · non-contrast
Comparison: none

CLINICAL DATA: Cervicalgia. Left sided neck pain extending into the
posterior aspect of the shoulder despite cervical spine fusion.
TECHNIQUE: Contiguous axial images were obtained through the cervical spine
after the intrathecal infusion of contrast. Coronal and sagittal
reconstructions were obtained of the axial image sets.

[Series 2: vasc adipose · 1 of 1 slices shown (1 of 12)]
[im 1/1]
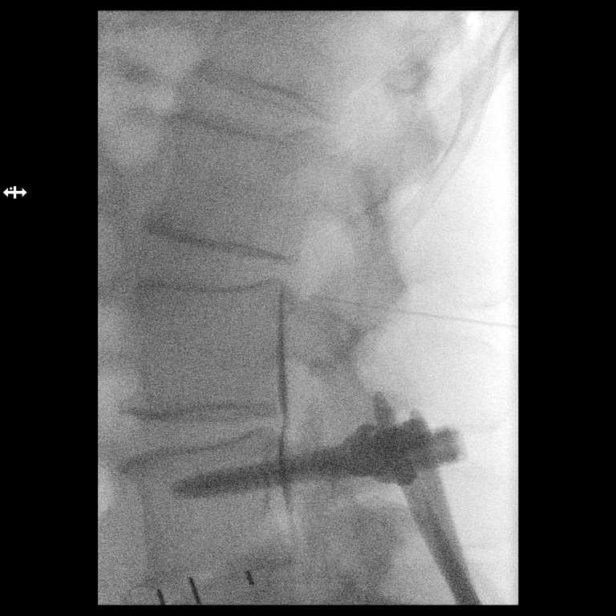

[Series 3: vasc adipose · 1 of 1 slices shown (2 of 12)]
[im 1/1]
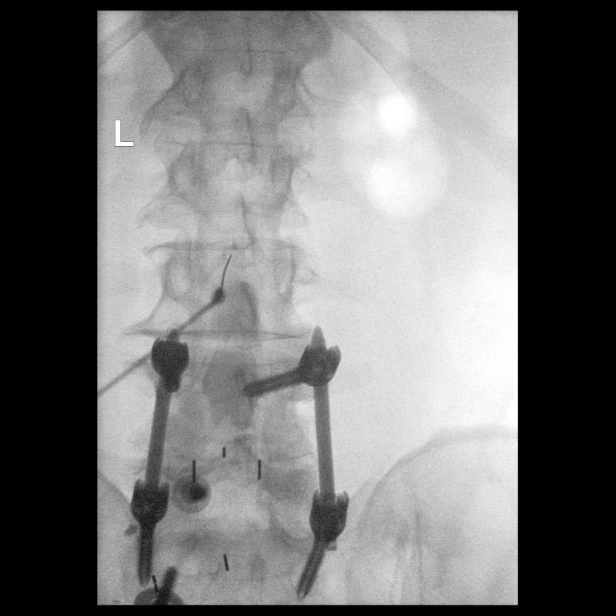

[Series 4: vasc adipose · 1 of 1 slices shown (3 of 12)]
[im 1/1]
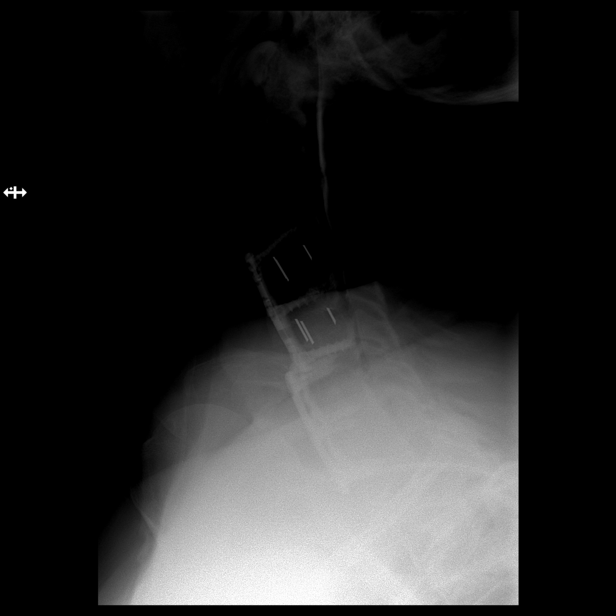

[Series 5: vasc adipose · 1 of 1 slices shown (4 of 12)]
[im 1/1]
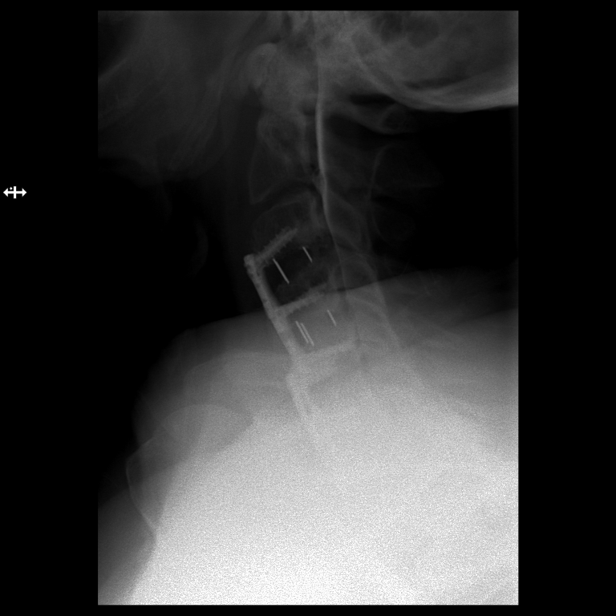

[Series 6: vasc adipose · 1 of 1 slices shown (5 of 12)]
[im 1/1]
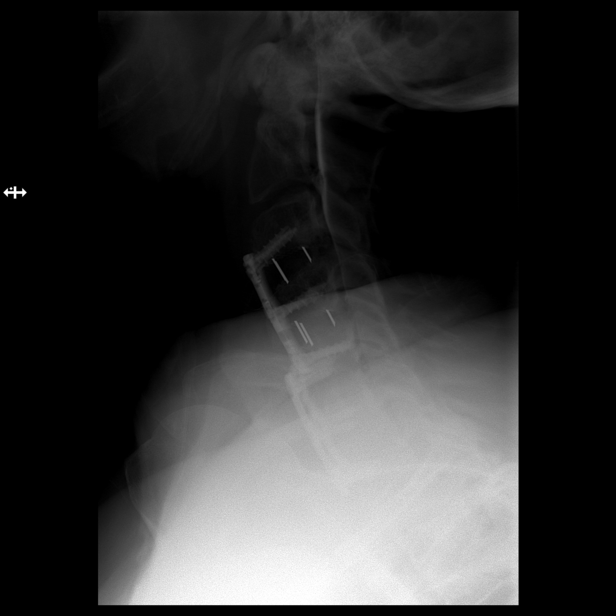

[Series 7: vasc adipose · 1 of 1 slices shown (6 of 12)]
[im 1/1]
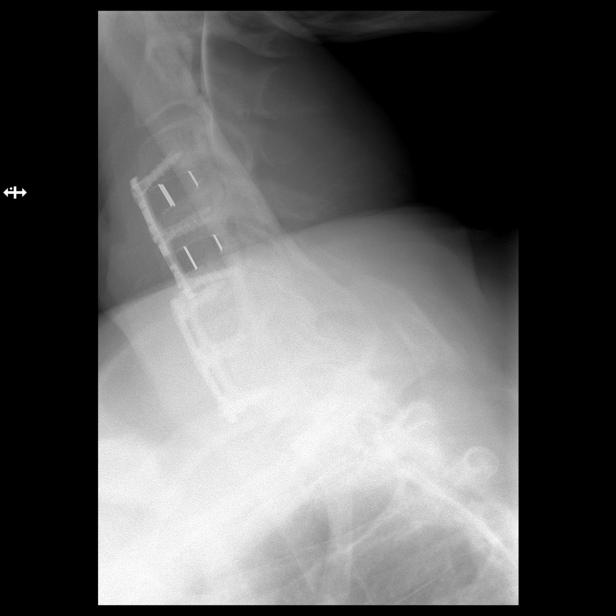

[Series 8: vasc adipose · 1 of 1 slices shown (7 of 12)]
[im 1/1]
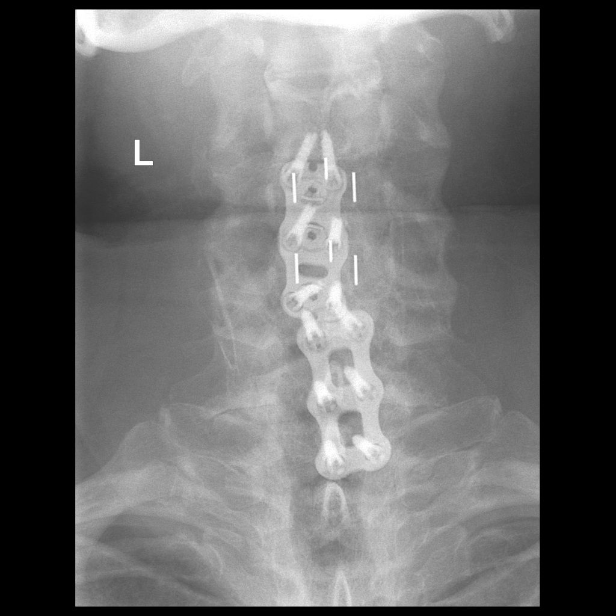

[Series 9: vasc adipose · 1 of 1 slices shown (8 of 12)]
[im 1/1]
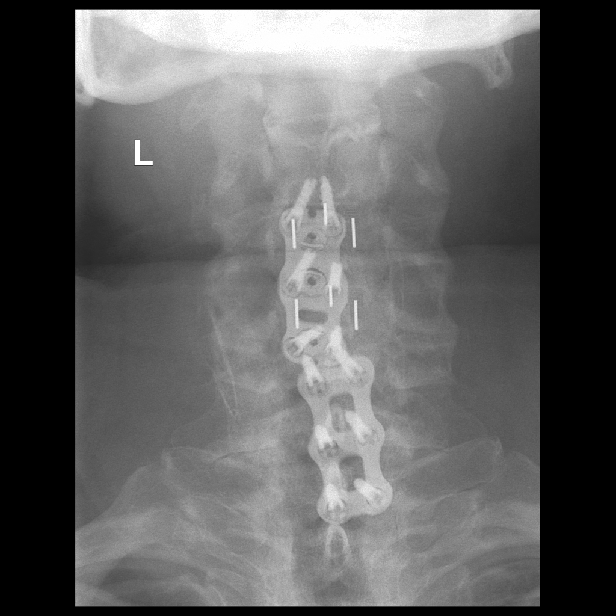

[Series 10: vasc adipose · 1 of 1 slices shown (9 of 12)]
[im 1/1]
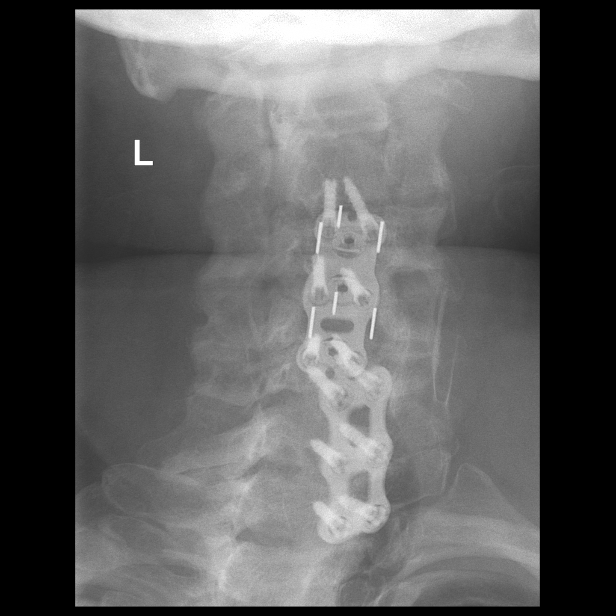

[Series 11: vasc adipose · 1 of 1 slices shown (10 of 12)]
[im 1/1]
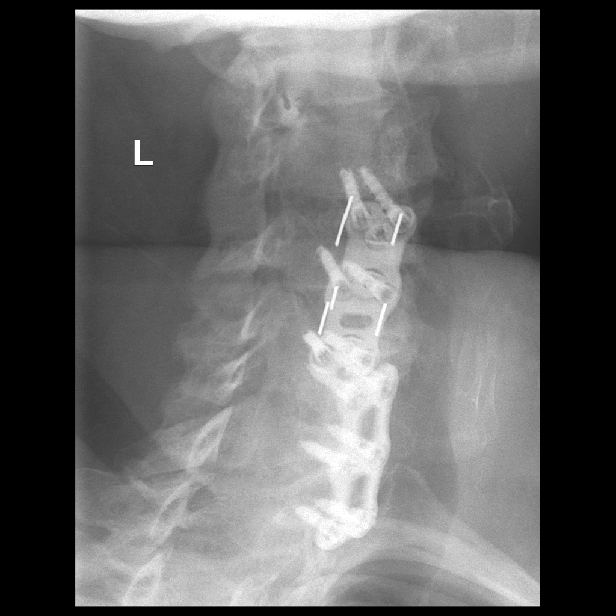

[Series 12: vasc adipose · 1 of 1 slices shown (11 of 12)]
[im 1/1]
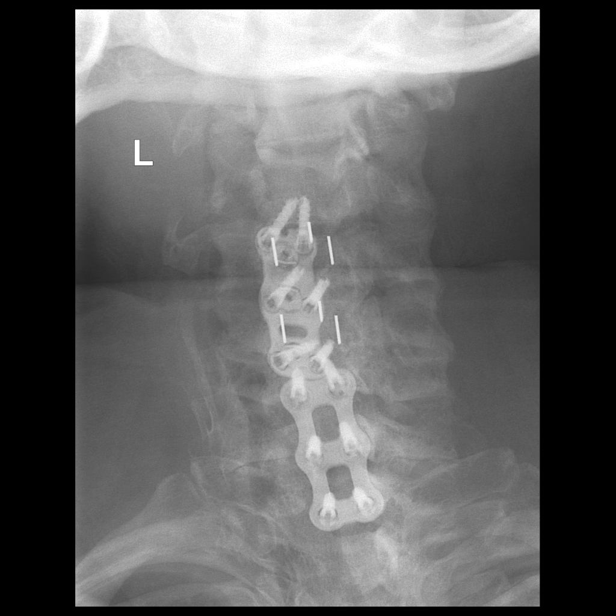

[Series 13: vasc adipose · 1 of 1 slices shown (12 of 12)]
[im 1/1]
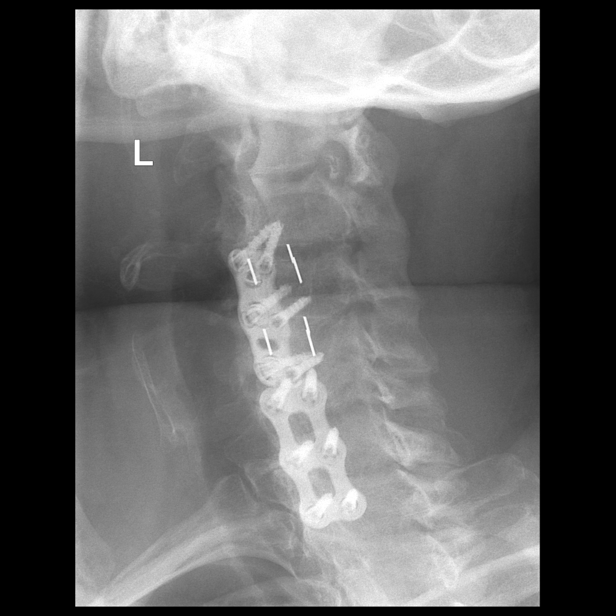

[12 of 12 positions shown; findings below may reference images not displayed]

EXAM:
CERVICAL MYELOGRAM

CT CERVICAL MYELOGRAM

FLUOROSCOPY TIME:  Radiation Exposure Index (as provided by the
fluoroscopic device): 276.27 uGy*m2

PROCEDURE:
LUMBAR PUNCTURE FOR CERVICAL MYELOGRAM

After thorough discussion of risks and benefits of the procedure
including bleeding, infection, injury to nerves, blood vessels,
adjacent structures as well as headache and CSF leak, written and
oral informed consent was obtained. Consent was obtained by Dr.
Golden Gines. We discussed the high likelihood of obtaining a
diagnostic study.

Patient was positioned prone on the fluoroscopy table. Local
anesthesia was provided with 1% lidocaine without epinephrine after
prepped and draped in the usual sterile fashion. Puncture was
performed at L2-3 using a 3 1/2 inch 22-gauge spinal needle via left
paramedian approach. Using a single pass through the dura, the
needle was placed within the thecal sac, with return of clear CSF.
10 mL of Isovue F-H44 was injected into the thecal sac, with normal
opacification of the nerve roots and cauda equina consistent with
free flow within the subarachnoid space. The patient was then moved
to the trendelenburg position and contrast flowed into the cervical
spine region.

I personally performed the lumbar puncture and administered the
intrathecal contrast. I also personally supervised acquisition of
the myelogram images.
FINDINGS: CERVICAL MYELOGRAM FINDINGS:

The cervical spine is fused anteriorly C3-C7. Hardware is intact. No
significant ventral stenosis is present. Foramina are not seen.

CT CERVICAL MYELOGRAM FINDINGS:

Cervical spine is imaged from the skull base through T1-2. Solid
fusion is present C3-7. Hardware is intact.

Craniocervical junction is within normal limits.

C2-3: A rightward disc osteophyte complex partially effaces the
ventral CSF. The foramina are patent bilaterally.

C3-4: Uncovertebral spurring contributes to mild foraminal narrowing
bilaterally. The central canal is patent.

C4-5: Solid fusion is present.  There is no significant stenosis.

C5-6: Asymmetric right-sided uncovertebral spurring is present. Mild
right foraminal narrowing is stable.

C6-7: Asymmetric right-sided uncovertebral spurring is stable.
Moderate right and mild left foraminal stenosis is present.

C7-T1: Asymmetric left-sided facet hypertrophy is again noted. Mild
left foraminal narrowing is present.
IMPRESSION: 1. Interval fusion at C3-4 and C4-5.
2. Mild residual foraminal narrowing bilaterally at C3-4.
3. No significant central canal stenosis at C3-4 or C4-5.
4. Stable fusion at C5-6 and C6-7 with residual foraminal narrowing
as described.
5. Asymmetric left-sided facet hypertrophy at C7-T1. Mild left
foraminal narrowing is present.

## 2020-04-27 DIAGNOSIS — N138 Other obstructive and reflux uropathy: Secondary | ICD-10-CM | POA: Diagnosis not present

## 2020-04-27 DIAGNOSIS — N401 Enlarged prostate with lower urinary tract symptoms: Secondary | ICD-10-CM | POA: Diagnosis not present

## 2020-04-29 DIAGNOSIS — Z9989 Dependence on other enabling machines and devices: Secondary | ICD-10-CM | POA: Diagnosis not present

## 2020-04-29 DIAGNOSIS — Q385 Congenital malformations of palate, not elsewhere classified: Secondary | ICD-10-CM | POA: Diagnosis not present

## 2020-04-29 DIAGNOSIS — J342 Deviated nasal septum: Secondary | ICD-10-CM | POA: Diagnosis not present

## 2020-04-29 DIAGNOSIS — J343 Hypertrophy of nasal turbinates: Secondary | ICD-10-CM | POA: Diagnosis not present

## 2020-04-29 DIAGNOSIS — J3489 Other specified disorders of nose and nasal sinuses: Secondary | ICD-10-CM | POA: Diagnosis not present

## 2020-04-29 DIAGNOSIS — R0683 Snoring: Secondary | ICD-10-CM | POA: Diagnosis not present

## 2020-04-29 DIAGNOSIS — H9190 Unspecified hearing loss, unspecified ear: Secondary | ICD-10-CM | POA: Diagnosis not present

## 2020-04-29 DIAGNOSIS — H61303 Acquired stenosis of external ear canal, unspecified, bilateral: Secondary | ICD-10-CM | POA: Diagnosis not present

## 2020-04-29 DIAGNOSIS — G4733 Obstructive sleep apnea (adult) (pediatric): Secondary | ICD-10-CM | POA: Diagnosis not present

## 2020-04-29 DIAGNOSIS — H6123 Impacted cerumen, bilateral: Secondary | ICD-10-CM | POA: Diagnosis not present

## 2020-04-30 DIAGNOSIS — R69 Illness, unspecified: Secondary | ICD-10-CM | POA: Diagnosis not present

## 2020-04-30 DIAGNOSIS — G4733 Obstructive sleep apnea (adult) (pediatric): Secondary | ICD-10-CM | POA: Diagnosis not present

## 2020-05-13 DIAGNOSIS — F329 Major depressive disorder, single episode, unspecified: Secondary | ICD-10-CM | POA: Diagnosis not present

## 2020-05-13 DIAGNOSIS — R69 Illness, unspecified: Secondary | ICD-10-CM | POA: Diagnosis not present

## 2020-05-21 DIAGNOSIS — R69 Illness, unspecified: Secondary | ICD-10-CM | POA: Diagnosis not present

## 2020-05-31 DIAGNOSIS — J342 Deviated nasal septum: Secondary | ICD-10-CM | POA: Diagnosis not present

## 2020-05-31 DIAGNOSIS — I10 Essential (primary) hypertension: Secondary | ICD-10-CM | POA: Diagnosis not present

## 2020-05-31 DIAGNOSIS — G4733 Obstructive sleep apnea (adult) (pediatric): Secondary | ICD-10-CM | POA: Diagnosis not present

## 2020-05-31 DIAGNOSIS — Z9989 Dependence on other enabling machines and devices: Secondary | ICD-10-CM | POA: Diagnosis not present

## 2020-05-31 DIAGNOSIS — J343 Hypertrophy of nasal turbinates: Secondary | ICD-10-CM | POA: Diagnosis not present

## 2020-05-31 DIAGNOSIS — J3489 Other specified disorders of nose and nasal sinuses: Secondary | ICD-10-CM | POA: Diagnosis not present

## 2020-05-31 DIAGNOSIS — R0981 Nasal congestion: Secondary | ICD-10-CM | POA: Diagnosis not present

## 2020-06-03 DIAGNOSIS — Z1152 Encounter for screening for COVID-19: Secondary | ICD-10-CM | POA: Diagnosis not present

## 2020-06-03 DIAGNOSIS — Z1159 Encounter for screening for other viral diseases: Secondary | ICD-10-CM | POA: Diagnosis not present

## 2020-06-09 DIAGNOSIS — G4733 Obstructive sleep apnea (adult) (pediatric): Secondary | ICD-10-CM | POA: Diagnosis not present

## 2020-06-09 DIAGNOSIS — J343 Hypertrophy of nasal turbinates: Secondary | ICD-10-CM | POA: Diagnosis not present

## 2020-06-09 DIAGNOSIS — J3489 Other specified disorders of nose and nasal sinuses: Secondary | ICD-10-CM | POA: Diagnosis not present

## 2020-06-09 DIAGNOSIS — J342 Deviated nasal septum: Secondary | ICD-10-CM | POA: Diagnosis not present

## 2020-06-09 DIAGNOSIS — R0981 Nasal congestion: Secondary | ICD-10-CM | POA: Diagnosis not present

## 2020-06-15 DIAGNOSIS — F419 Anxiety disorder, unspecified: Secondary | ICD-10-CM | POA: Diagnosis not present

## 2020-06-15 DIAGNOSIS — R69 Illness, unspecified: Secondary | ICD-10-CM | POA: Diagnosis not present

## 2020-07-13 DIAGNOSIS — F419 Anxiety disorder, unspecified: Secondary | ICD-10-CM | POA: Diagnosis not present

## 2020-07-13 DIAGNOSIS — R69 Illness, unspecified: Secondary | ICD-10-CM | POA: Diagnosis not present

## 2020-07-28 DIAGNOSIS — L219 Seborrheic dermatitis, unspecified: Secondary | ICD-10-CM | POA: Diagnosis not present

## 2020-07-30 DIAGNOSIS — H6123 Impacted cerumen, bilateral: Secondary | ICD-10-CM | POA: Diagnosis not present

## 2020-07-30 DIAGNOSIS — J3489 Other specified disorders of nose and nasal sinuses: Secondary | ICD-10-CM | POA: Diagnosis not present

## 2020-07-30 DIAGNOSIS — G4733 Obstructive sleep apnea (adult) (pediatric): Secondary | ICD-10-CM | POA: Diagnosis not present

## 2020-07-30 DIAGNOSIS — H9190 Unspecified hearing loss, unspecified ear: Secondary | ICD-10-CM | POA: Diagnosis not present

## 2020-07-30 DIAGNOSIS — H61303 Acquired stenosis of external ear canal, unspecified, bilateral: Secondary | ICD-10-CM | POA: Diagnosis not present

## 2020-07-30 DIAGNOSIS — Z9989 Dependence on other enabling machines and devices: Secondary | ICD-10-CM | POA: Diagnosis not present

## 2020-08-09 DIAGNOSIS — Z77122 Contact with and (suspected) exposure to noise: Secondary | ICD-10-CM | POA: Diagnosis not present

## 2020-08-09 DIAGNOSIS — H9319 Tinnitus, unspecified ear: Secondary | ICD-10-CM | POA: Diagnosis not present

## 2020-08-09 DIAGNOSIS — H61303 Acquired stenosis of external ear canal, unspecified, bilateral: Secondary | ICD-10-CM | POA: Diagnosis not present

## 2020-08-09 DIAGNOSIS — H903 Sensorineural hearing loss, bilateral: Secondary | ICD-10-CM | POA: Diagnosis not present

## 2020-09-01 DIAGNOSIS — I700 Atherosclerosis of aorta: Secondary | ICD-10-CM | POA: Diagnosis not present

## 2020-09-01 DIAGNOSIS — I2510 Atherosclerotic heart disease of native coronary artery without angina pectoris: Secondary | ICD-10-CM | POA: Diagnosis not present

## 2020-09-01 DIAGNOSIS — I712 Thoracic aortic aneurysm, without rupture: Secondary | ICD-10-CM | POA: Diagnosis not present

## 2020-10-04 DIAGNOSIS — J189 Pneumonia, unspecified organism: Secondary | ICD-10-CM | POA: Diagnosis not present

## 2020-10-04 DIAGNOSIS — Z20828 Contact with and (suspected) exposure to other viral communicable diseases: Secondary | ICD-10-CM | POA: Diagnosis not present

## 2020-10-11 DIAGNOSIS — J189 Pneumonia, unspecified organism: Secondary | ICD-10-CM | POA: Diagnosis not present

## 2020-10-13 DIAGNOSIS — R5381 Other malaise: Secondary | ICD-10-CM | POA: Diagnosis not present

## 2020-10-13 DIAGNOSIS — I10 Essential (primary) hypertension: Secondary | ICD-10-CM | POA: Diagnosis not present

## 2020-10-13 DIAGNOSIS — R0902 Hypoxemia: Secondary | ICD-10-CM | POA: Diagnosis not present

## 2020-10-13 DIAGNOSIS — J1282 Pneumonia due to coronavirus disease 2019: Secondary | ICD-10-CM | POA: Diagnosis not present

## 2020-10-13 DIAGNOSIS — J189 Pneumonia, unspecified organism: Secondary | ICD-10-CM | POA: Diagnosis not present

## 2020-10-13 DIAGNOSIS — R0602 Shortness of breath: Secondary | ICD-10-CM | POA: Diagnosis not present

## 2020-10-13 DIAGNOSIS — I517 Cardiomegaly: Secondary | ICD-10-CM | POA: Diagnosis not present

## 2020-10-13 DIAGNOSIS — U071 COVID-19: Secondary | ICD-10-CM | POA: Diagnosis not present

## 2020-10-13 DIAGNOSIS — R059 Cough, unspecified: Secondary | ICD-10-CM | POA: Diagnosis not present

## 2020-10-13 DIAGNOSIS — R509 Fever, unspecified: Secondary | ICD-10-CM | POA: Diagnosis not present

## 2020-10-17 ENCOUNTER — Other Ambulatory Visit: Payer: Self-pay

## 2020-10-17 ENCOUNTER — Emergency Department (HOSPITAL_COMMUNITY)
Admission: EM | Admit: 2020-10-17 | Discharge: 2020-10-17 | Disposition: A | Discharge status: Home / Self Care | Payer: Medicare HMO | Attending: Emergency Medicine | Admitting: Emergency Medicine

## 2020-10-17 ENCOUNTER — Telehealth: Payer: Self-pay | Admitting: Surgery

## 2020-10-17 ENCOUNTER — Encounter (HOSPITAL_COMMUNITY): Payer: Self-pay | Admitting: Emergency Medicine

## 2020-10-17 ENCOUNTER — Emergency Department (HOSPITAL_COMMUNITY): Payer: Medicare HMO

## 2020-10-17 DIAGNOSIS — I10 Essential (primary) hypertension: Secondary | ICD-10-CM | POA: Insufficient documentation

## 2020-10-17 DIAGNOSIS — U071 COVID-19: Secondary | ICD-10-CM | POA: Insufficient documentation

## 2020-10-17 DIAGNOSIS — R0602 Shortness of breath: Secondary | ICD-10-CM | POA: Diagnosis not present

## 2020-10-17 DIAGNOSIS — R069 Unspecified abnormalities of breathing: Secondary | ICD-10-CM | POA: Diagnosis not present

## 2020-10-17 DIAGNOSIS — R2243 Localized swelling, mass and lump, lower limb, bilateral: Secondary | ICD-10-CM | POA: Diagnosis not present

## 2020-10-17 DIAGNOSIS — E039 Hypothyroidism, unspecified: Secondary | ICD-10-CM | POA: Diagnosis not present

## 2020-10-17 DIAGNOSIS — Z79899 Other long term (current) drug therapy: Secondary | ICD-10-CM | POA: Insufficient documentation

## 2020-10-17 DIAGNOSIS — J1282 Pneumonia due to coronavirus disease 2019: Secondary | ICD-10-CM | POA: Diagnosis not present

## 2020-10-17 DIAGNOSIS — R0600 Dyspnea, unspecified: Secondary | ICD-10-CM | POA: Diagnosis not present

## 2020-10-17 DIAGNOSIS — J189 Pneumonia, unspecified organism: Secondary | ICD-10-CM | POA: Diagnosis not present

## 2020-10-17 LAB — BASIC METABOLIC PANEL
Anion gap: 10 (ref 5–15)
BUN: 10 mg/dL (ref 8–23)
CO2: 26 mmol/L (ref 22–32)
Calcium: 7.5 mg/dL — ABNORMAL LOW (ref 8.9–10.3)
Chloride: 101 mmol/L (ref 98–111)
Creatinine, Ser: 0.86 mg/dL (ref 0.61–1.24)
GFR, Estimated: >60 mL/min (ref >60)
Glucose, Bld: 87 mg/dL (ref 70–99)
Potassium: 3.2 mmol/L — ABNORMAL LOW (ref 3.5–5.1)
Sodium: 137 mmol/L (ref 135–145)

## 2020-10-17 LAB — CBC
HCT: 38 % — ABNORMAL LOW (ref 39.0–52.0)
Hemoglobin: 12.1 g/dL — ABNORMAL LOW (ref 13.0–17.0)
MCH: 26.3 pg (ref 26.0–34.0)
MCHC: 31.8 g/dL (ref 30.0–36.0)
MCV: 82.6 fL (ref 80.0–100.0)
Platelets: 185 10*3/uL (ref 150–400)
RBC: 4.6 MIL/uL (ref 4.22–5.81)
RDW: 15.9 % — ABNORMAL HIGH (ref 11.5–15.5)
WBC: 4.3 10*3/uL (ref 4.0–10.5)
nRBC: 0 % (ref 0.0–0.2)

## 2020-10-17 MED ORDER — POTASSIUM CHLORIDE ER 10 MEQ PO TBCR: 10.0000 meq | EXTENDED_RELEASE_TABLET | Freq: Every day | ORAL | 0 refills | Status: AC

## 2020-10-17 MED ORDER — DEXAMETHASONE SODIUM PHOSPHATE 10 MG/ML IJ SOLN
10.0000 mg | Freq: Once | INTRAMUSCULAR | Status: AC
  Administered 2020-10-17: 10 mg via INTRAMUSCULAR
  Filled 2020-10-17: qty 1

## 2020-10-17 MED ORDER — ALBUTEROL SULFATE HFA 108 (90 BASE) MCG/ACT IN AERS
4.0000 | INHALATION_SPRAY | Freq: Once | RESPIRATORY_TRACT | Status: AC
  Administered 2020-10-17: 4 via RESPIRATORY_TRACT
  Filled 2020-10-17: qty 6.7

## 2020-10-17 MED ORDER — POTASSIUM CHLORIDE CRYS ER 20 MEQ PO TBCR
40.0000 meq | EXTENDED_RELEASE_TABLET | Freq: Once | ORAL | Status: AC
  Administered 2020-10-17: 40 meq via ORAL
  Filled 2020-10-17: qty 2

## 2020-10-17 NOTE — ED Notes (Signed)
Pulse 94-95% when ambulating

## 2020-10-17 NOTE — Discharge Instructions (Addendum)
Use the albuterol as needed for shortness of breath and wheezing.  Please make sure to return to the ER if your oxygen saturations drop below 90%.  Please make sure to follow-up with your primary care doctor.  Continue drinking plenty of fluids, resting.  Your potassium was low today. Please take the supplements daily and eat high potassium foods   I have reached out to our social work team to help facilitate organizing the Covid at home program for you.  Unfortunately are not available on the weekends right now, and we cannot send people home with oxygen from the ER.  Our team should be reaching out to you to help facilitate this.  If they deem needed, they will order oxygen for you.  Please make sure to return to the ER if you have any new or worsening symptoms.

## 2020-10-17 NOTE — Telephone Encounter (Signed)
ED CM received consult concerning [facility] to [facility]. CM reviewed record and noted patient resides in [place], the criteria is up to 30 miles from [facility] and within [facility]. CM called [facility] [employer] confirm information was confirmed. [facility] Liaison will send the information in tomorrow for review anyway. CM updated EDP. CM updated patient and spouse as well.

## 2020-10-17 NOTE — ED Provider Notes (Addendum)
[facility] Provider [facility]   CSN: [id number] Arrival date & time: 10/17/20  1336     History Chief Complaint  Patient presents with  . Covid Positive    Joshua Griffin is a 59 y.o. male.  HPI 59 year old male with a history of BPH, liver cirrhosis, hyperlipidemia, hypertension, hypothyroidism, OSA presents to the ER with Covid symptoms.  Patient states that he tested positive for Covid on 12/23 in the [facility] ED.  Has been having ongoing shortness of breath with ambulation.  States he has had a pulse oximeter at home and his oxygen saturations have stayed above 90%.  He is unsure of when his symptoms started exactly, states he also had a positive test at his PCPs office on 12/21.  He was told that he is outside of the window for the monoclonal antibody infusion.  No history of asthma.  No nausea or vomiting.  No chest pain.  No diarrhea.    Past Medical History:  Diagnosis Date  . BPH (benign prostatic hyperplasia)   . Cirrhosis of liver ([place])   . Depression   . Hyperlipidemia   . Hypertension   . OSA (obstructive sleep apnea)   . Thyroid disease    hypothyroidism    Patient Active Problem List   Diagnosis Date Noted  . Thoracic aortic aneurysm without rupture ([place]) 07/25/2017  . OSA (obstructive sleep apnea)   . Cirrhosis of liver ([place])   . Depression   . Hyperlipidemia   . BPH (benign prostatic hyperplasia)   . Hypertension     Past Surgical History:  Procedure Laterality Date  . arthroscopy left knee    . cataract phaco bilaterally    . CERVICAL FUSION     x 2  . CYSTOSCOPY WITH INSERTION OF UROLIFT    . LUMBAR LAMINECTOMY     with hardware       Family History  Problem Relation Age of Onset  . Cancer Father        LIVER and LUNG  . Hyperlipidemia Father   . Thyroid disease Father   . Alzheimer's disease Paternal Grandfather     Social History   Tobacco Use  . Smoking status: Never Smoker  . Smokeless  tobacco: Never Used  Vaping Use  . Vaping Use: Never used  Substance Use Topics  . Alcohol use: No    Comment: HX OF ETOH ABUSE  . Drug use: No    Home Medications Prior to Admission medications   Medication Sig Start Date End Date Taking? Authorizing Provider  amLODipine-benazepril (LOTREL) 5-20 MG capsule daily. 01/01/17   [provider]  doxazosin (CARDURA) 8 MG tablet daily. 03/03/16   [provider]  levothyroxine (SYNTHROID, LEVOTHROID) 112 MCG tablet Take 125 mcg by mouth daily.  07/14/17   [provider]  lovastatin (MEVACOR) 20 MG tablet daily. 12/23/16   [provider]  omeprazole (PRILOSEC) 20 MG capsule Take 20 mg by mouth daily.    [provider]  potassium chloride (KLOR-CON) 10 MEQ tablet Take 1 tablet (10 mEq total) by mouth daily. 10/17/20 11/16/20  Garald Balding, PA-C  valproic acid (DEPAKENE) 250 MG capsule Take 250 mg by mouth 4 (four) times daily.    [provider]  Venlafaxine HCl 150 MG TB24 daily. 02/08/17   [provider]    Allergies    Patient has no known allergies.  Review of Systems   Review of Systems  Constitutional: Positive for  activity change, appetite change, chills, fatigue and fever.  Respiratory: Positive for cough and shortness of breath.   Cardiovascular: Negative for chest pain.  Gastrointestinal: Negative for abdominal pain, diarrhea, nausea and vomiting.  Genitourinary: Negative for dysuria.  Neurological: Positive for weakness.    Physical Exam Updated Vital Signs BP (!) 145/92   Pulse 62   Temp 98.4 F (36.9 C) (Oral)   Resp 20   SpO2 92%   Physical Exam Vitals and nursing note reviewed.  Constitutional:      General: He is not in acute distress.    Appearance: He is well-developed and well-nourished. He is not ill-appearing, toxic-appearing or diaphoretic.  HENT:     Head: Normocephalic and atraumatic.  Eyes:     Conjunctiva/sclera: Conjunctivae normal.   Cardiovascular:     Rate and Rhythm: Normal rate and regular rhythm.     Pulses: Normal pulses.     Heart sounds: No murmur heard.   Pulmonary:     Effort: Pulmonary effort is normal. No respiratory distress.     Breath sounds: No stridor. Wheezing present. No rhonchi or rales.     Comments: Mild audible wheezes noted. Lung sounds mildly decreased in all 4 quadrants. Speaking in full sentences without increased work of breathing Chest:     Chest wall: No tenderness.  Abdominal:     General: Abdomen is flat.     Palpations: Abdomen is soft.     Tenderness: There is no abdominal tenderness.  Musculoskeletal:        General: No edema. Normal range of motion.     Cervical back: Neck supple.  Skin:    General: Skin is warm and dry.  Neurological:     Mental Status: He is alert.  Psychiatric:        Mood and Affect: Mood and affect normal.     ED Results / Procedures / Treatments   Labs (all labs ordered are listed, but only abnormal results are displayed) Labs Reviewed  BASIC METABOLIC PANEL - Abnormal; Notable for the following components:      Result Value   Potassium 3.2 (*)    Calcium 7.5 (*)    All other components within normal limits  CBC - Abnormal; Notable for the following components:   Hemoglobin 12.1 (*)    HCT 38.0 (*)    RDW 15.9 (*)    All other components within normal limits    EKG None  Radiology DG Chest 2 View  Result Date: 10/17/2020 CLINICAL DATA:  COVID-19 positivity with shortness of breath EXAM: CHEST - 2 VIEW COMPARISON:  10/13/2020 FINDINGS: Cardiac shadow is stable. The lungs are well aerated bilaterally. Some improvement in the patchy airspace disease is noted when compared with the prior CT examination. No new focal abnormality is seen. IMPRESSION: Mildly improved aeration when compare with the prior exam. Electronically Signed   By: Inez Catalina M.D.   On: 10/17/2020 14:08    Procedures Procedures (including critical care  time)  Medications Ordered in ED Medications  albuterol (VENTOLIN HFA) 108 (90 Base) MCG/ACT inhaler 4 puff (4 puffs Inhalation Given 10/17/20 1530)  dexamethasone (DECADRON) injection 10 mg (10 mg Intramuscular Given 10/17/20 1531)  potassium chloride SA (KLOR-CON) CR tablet 40 mEq (40 mEq Oral Given 10/17/20 1734)    ED Course  I have reviewed the triage vital signs and the nursing notes.  Pertinent labs & imaging results that were available during my care of the patient were reviewed  by me and considered in my medical decision making (see chart for details).    MDM Rules/Calculators/A&P                          59 year old male with Covid symptoms, positive Covid test on 6/21 and 6/23.  Vitals overall reassuring.  No evidence of hypoxia.  Ambulated in the ED with no desaturations.  Mild wheezing noted on exam, treated with albuterol and a shot of Decadron.  CBC and BMP largely unremarkable. Potassium of 3.2, repleted here and sent home potassium script. EKG Ns rhythm.  Chest x-ray without evidence of pneumonia. Low suspicion for PE.  Patient will be discharged with supportive measures.  He is not eligible for the MAB infusion as he is outside the window.  Encouraged PCP follow-up.   Called to bedside by nursing staff as the patient's wife states that the patient's oxygen saturations dropped to 88% when he is sleeping with the CPAP.  He has sleep apnea.  She is very concerned about this.  I explained to the patient's wife that his oxygen saturations have remained above 95% here even with ambulation.  She is still insisting that he go home with supplemental oxygen.  I explained to her that this cannot be done over the weekend, however I will reach out to our transition of care team to help facilitate the Covid at home team and they can come evaluate him.  If they deem that he needs oxygen, they will prescribe it.  I also stressed that he needs to follow-up with his PCP as he may need to have his  CPAP settings readjusted.   He  voiced understanding and are agreeable.  At this stage in the ED course, the patient is medically screened and stable for discharge. Final Clinical Impression(s) / ED Diagnoses Final diagnoses:  [id number]    Rx / DC Orders ED Discharge Orders         Ordered    potassium chloride (KLOR-CON) 10 MEQ tablet  Daily        10/17/20 1653              Malvin Johns, MD 10/17/20 1731    Garald Balding, PA-C 10/17/20 1831    Malvin Johns, MD 10/17/20 1836

## 2020-10-17 NOTE — ED Triage Notes (Signed)
Per EMS, patient from home, Covid positive 12/23. C/o SOB with cough.

## 2020-10-27 DIAGNOSIS — E039 Hypothyroidism, unspecified: Secondary | ICD-10-CM | POA: Diagnosis not present

## 2020-10-27 DIAGNOSIS — M159 Polyosteoarthritis, unspecified: Secondary | ICD-10-CM | POA: Diagnosis not present

## 2020-10-27 DIAGNOSIS — J189 Pneumonia, unspecified organism: Secondary | ICD-10-CM | POA: Diagnosis not present

## 2020-10-27 DIAGNOSIS — E785 Hyperlipidemia, unspecified: Secondary | ICD-10-CM | POA: Diagnosis not present

## 2020-10-27 DIAGNOSIS — I714 Abdominal aortic aneurysm, without rupture: Secondary | ICD-10-CM | POA: Diagnosis not present

## 2020-10-27 DIAGNOSIS — R69 Illness, unspecified: Secondary | ICD-10-CM | POA: Diagnosis not present

## 2020-10-27 DIAGNOSIS — I10 Essential (primary) hypertension: Secondary | ICD-10-CM | POA: Diagnosis not present

## 2020-10-27 DIAGNOSIS — K7460 Unspecified cirrhosis of liver: Secondary | ICD-10-CM | POA: Diagnosis not present

## 2020-10-27 DIAGNOSIS — R5382 Chronic fatigue, unspecified: Secondary | ICD-10-CM | POA: Diagnosis not present

## 2020-10-28 DIAGNOSIS — J189 Pneumonia, unspecified organism: Secondary | ICD-10-CM | POA: Diagnosis not present

## 2020-10-29 DIAGNOSIS — F419 Anxiety disorder, unspecified: Secondary | ICD-10-CM | POA: Diagnosis not present

## 2020-10-29 DIAGNOSIS — Z79899 Other long term (current) drug therapy: Secondary | ICD-10-CM | POA: Diagnosis not present

## 2020-10-29 DIAGNOSIS — M47816 Spondylosis without myelopathy or radiculopathy, lumbar region: Secondary | ICD-10-CM | POA: Diagnosis not present

## 2020-10-29 DIAGNOSIS — G4733 Obstructive sleep apnea (adult) (pediatric): Secondary | ICD-10-CM | POA: Diagnosis not present

## 2020-10-29 DIAGNOSIS — R69 Illness, unspecified: Secondary | ICD-10-CM | POA: Diagnosis not present

## 2020-11-02 DIAGNOSIS — E785 Hyperlipidemia, unspecified: Secondary | ICD-10-CM | POA: Diagnosis not present

## 2020-11-02 DIAGNOSIS — J208 Acute bronchitis due to other specified organisms: Secondary | ICD-10-CM | POA: Diagnosis not present

## 2020-11-02 DIAGNOSIS — B9689 Other specified bacterial agents as the cause of diseases classified elsewhere: Secondary | ICD-10-CM | POA: Diagnosis not present

## 2020-11-02 DIAGNOSIS — I714 Abdominal aortic aneurysm, without rupture: Secondary | ICD-10-CM | POA: Diagnosis not present

## 2020-11-02 DIAGNOSIS — M159 Polyosteoarthritis, unspecified: Secondary | ICD-10-CM | POA: Diagnosis not present

## 2020-11-02 DIAGNOSIS — R69 Illness, unspecified: Secondary | ICD-10-CM | POA: Diagnosis not present

## 2020-11-02 DIAGNOSIS — K7460 Unspecified cirrhosis of liver: Secondary | ICD-10-CM | POA: Diagnosis not present

## 2020-11-02 DIAGNOSIS — D649 Anemia, unspecified: Secondary | ICD-10-CM | POA: Diagnosis not present

## 2020-11-02 DIAGNOSIS — Z79899 Other long term (current) drug therapy: Secondary | ICD-10-CM | POA: Diagnosis not present

## 2020-11-02 DIAGNOSIS — I10 Essential (primary) hypertension: Secondary | ICD-10-CM | POA: Diagnosis not present

## 2020-11-09 DIAGNOSIS — R69 Illness, unspecified: Secondary | ICD-10-CM | POA: Diagnosis not present

## 2020-11-09 DIAGNOSIS — G4733 Obstructive sleep apnea (adult) (pediatric): Secondary | ICD-10-CM | POA: Diagnosis not present

## 2020-11-09 DIAGNOSIS — K7460 Unspecified cirrhosis of liver: Secondary | ICD-10-CM | POA: Diagnosis not present

## 2020-11-09 DIAGNOSIS — M159 Polyosteoarthritis, unspecified: Secondary | ICD-10-CM | POA: Diagnosis not present

## 2020-11-09 DIAGNOSIS — I10 Essential (primary) hypertension: Secondary | ICD-10-CM | POA: Diagnosis not present

## 2020-11-09 DIAGNOSIS — E785 Hyperlipidemia, unspecified: Secondary | ICD-10-CM | POA: Diagnosis not present

## 2020-11-09 DIAGNOSIS — E039 Hypothyroidism, unspecified: Secondary | ICD-10-CM | POA: Diagnosis not present

## 2020-11-09 DIAGNOSIS — D509 Iron deficiency anemia, unspecified: Secondary | ICD-10-CM | POA: Diagnosis not present

## 2020-11-09 DIAGNOSIS — I714 Abdominal aortic aneurysm, without rupture: Secondary | ICD-10-CM | POA: Diagnosis not present

## 2020-11-11 DIAGNOSIS — R69 Illness, unspecified: Secondary | ICD-10-CM | POA: Diagnosis not present

## 2020-11-11 DIAGNOSIS — K760 Fatty (change of) liver, not elsewhere classified: Secondary | ICD-10-CM | POA: Diagnosis not present

## 2020-11-22 DIAGNOSIS — R69 Illness, unspecified: Secondary | ICD-10-CM | POA: Diagnosis not present

## 2020-11-22 DIAGNOSIS — K7460 Unspecified cirrhosis of liver: Secondary | ICD-10-CM | POA: Diagnosis not present

## 2020-11-22 DIAGNOSIS — I714 Abdominal aortic aneurysm, without rupture: Secondary | ICD-10-CM | POA: Diagnosis not present

## 2020-11-22 DIAGNOSIS — G4733 Obstructive sleep apnea (adult) (pediatric): Secondary | ICD-10-CM | POA: Diagnosis not present

## 2020-11-22 DIAGNOSIS — M159 Polyosteoarthritis, unspecified: Secondary | ICD-10-CM | POA: Diagnosis not present

## 2020-11-22 DIAGNOSIS — E785 Hyperlipidemia, unspecified: Secondary | ICD-10-CM | POA: Diagnosis not present

## 2020-11-22 DIAGNOSIS — I10 Essential (primary) hypertension: Secondary | ICD-10-CM | POA: Diagnosis not present

## 2020-11-22 DIAGNOSIS — E039 Hypothyroidism, unspecified: Secondary | ICD-10-CM | POA: Diagnosis not present

## 2020-11-22 DIAGNOSIS — D509 Iron deficiency anemia, unspecified: Secondary | ICD-10-CM | POA: Diagnosis not present

## 2020-11-30 DIAGNOSIS — D509 Iron deficiency anemia, unspecified: Secondary | ICD-10-CM | POA: Diagnosis not present

## 2020-11-30 DIAGNOSIS — I714 Abdominal aortic aneurysm, without rupture: Secondary | ICD-10-CM | POA: Diagnosis not present

## 2020-11-30 DIAGNOSIS — M159 Polyosteoarthritis, unspecified: Secondary | ICD-10-CM | POA: Diagnosis not present

## 2020-11-30 DIAGNOSIS — E785 Hyperlipidemia, unspecified: Secondary | ICD-10-CM | POA: Diagnosis not present

## 2020-11-30 DIAGNOSIS — R69 Illness, unspecified: Secondary | ICD-10-CM | POA: Diagnosis not present

## 2020-11-30 DIAGNOSIS — K7460 Unspecified cirrhosis of liver: Secondary | ICD-10-CM | POA: Diagnosis not present

## 2020-11-30 DIAGNOSIS — E039 Hypothyroidism, unspecified: Secondary | ICD-10-CM | POA: Diagnosis not present

## 2020-11-30 DIAGNOSIS — I10 Essential (primary) hypertension: Secondary | ICD-10-CM | POA: Diagnosis not present

## 2020-11-30 DIAGNOSIS — G4733 Obstructive sleep apnea (adult) (pediatric): Secondary | ICD-10-CM | POA: Diagnosis not present

## 2020-12-03 DIAGNOSIS — M47812 Spondylosis without myelopathy or radiculopathy, cervical region: Secondary | ICD-10-CM | POA: Diagnosis not present

## 2020-12-03 DIAGNOSIS — M791 Myalgia, unspecified site: Secondary | ICD-10-CM | POA: Diagnosis not present

## 2020-12-06 DIAGNOSIS — G4733 Obstructive sleep apnea (adult) (pediatric): Secondary | ICD-10-CM | POA: Diagnosis not present

## 2020-12-06 DIAGNOSIS — E039 Hypothyroidism, unspecified: Secondary | ICD-10-CM | POA: Diagnosis not present

## 2020-12-06 DIAGNOSIS — E785 Hyperlipidemia, unspecified: Secondary | ICD-10-CM | POA: Diagnosis not present

## 2020-12-06 DIAGNOSIS — D509 Iron deficiency anemia, unspecified: Secondary | ICD-10-CM | POA: Diagnosis not present

## 2020-12-06 DIAGNOSIS — M159 Polyosteoarthritis, unspecified: Secondary | ICD-10-CM | POA: Diagnosis not present

## 2020-12-06 DIAGNOSIS — I10 Essential (primary) hypertension: Secondary | ICD-10-CM | POA: Diagnosis not present

## 2020-12-06 DIAGNOSIS — I714 Abdominal aortic aneurysm, without rupture: Secondary | ICD-10-CM | POA: Diagnosis not present

## 2020-12-06 DIAGNOSIS — R69 Illness, unspecified: Secondary | ICD-10-CM | POA: Diagnosis not present

## 2020-12-06 DIAGNOSIS — K7460 Unspecified cirrhosis of liver: Secondary | ICD-10-CM | POA: Diagnosis not present

## 2020-12-09 DIAGNOSIS — M25512 Pain in left shoulder: Secondary | ICD-10-CM | POA: Diagnosis not present

## 2020-12-09 DIAGNOSIS — M2560 Stiffness of unspecified joint, not elsewhere classified: Secondary | ICD-10-CM | POA: Diagnosis not present

## 2020-12-09 DIAGNOSIS — M542 Cervicalgia: Secondary | ICD-10-CM | POA: Diagnosis not present

## 2020-12-09 DIAGNOSIS — R293 Abnormal posture: Secondary | ICD-10-CM | POA: Diagnosis not present

## 2020-12-13 DIAGNOSIS — K5900 Constipation, unspecified: Secondary | ICD-10-CM | POA: Diagnosis not present

## 2020-12-13 DIAGNOSIS — K760 Fatty (change of) liver, not elsewhere classified: Secondary | ICD-10-CM | POA: Diagnosis not present

## 2020-12-15 DIAGNOSIS — R293 Abnormal posture: Secondary | ICD-10-CM | POA: Diagnosis not present

## 2020-12-15 DIAGNOSIS — M25512 Pain in left shoulder: Secondary | ICD-10-CM | POA: Diagnosis not present

## 2020-12-15 DIAGNOSIS — M542 Cervicalgia: Secondary | ICD-10-CM | POA: Diagnosis not present

## 2020-12-15 DIAGNOSIS — M2560 Stiffness of unspecified joint, not elsewhere classified: Secondary | ICD-10-CM | POA: Diagnosis not present

## 2021-01-03 DIAGNOSIS — M159 Polyosteoarthritis, unspecified: Secondary | ICD-10-CM | POA: Diagnosis not present

## 2021-01-03 DIAGNOSIS — R69 Illness, unspecified: Secondary | ICD-10-CM | POA: Diagnosis not present

## 2021-01-03 DIAGNOSIS — I10 Essential (primary) hypertension: Secondary | ICD-10-CM | POA: Diagnosis not present

## 2021-01-03 DIAGNOSIS — K7460 Unspecified cirrhosis of liver: Secondary | ICD-10-CM | POA: Diagnosis not present

## 2021-01-03 DIAGNOSIS — G4733 Obstructive sleep apnea (adult) (pediatric): Secondary | ICD-10-CM | POA: Diagnosis not present

## 2021-01-03 DIAGNOSIS — E039 Hypothyroidism, unspecified: Secondary | ICD-10-CM | POA: Diagnosis not present

## 2021-01-03 DIAGNOSIS — E785 Hyperlipidemia, unspecified: Secondary | ICD-10-CM | POA: Diagnosis not present

## 2021-01-03 DIAGNOSIS — I714 Abdominal aortic aneurysm, without rupture: Secondary | ICD-10-CM | POA: Diagnosis not present

## 2021-01-03 DIAGNOSIS — D509 Iron deficiency anemia, unspecified: Secondary | ICD-10-CM | POA: Diagnosis not present

## 2021-01-05 DIAGNOSIS — M546 Pain in thoracic spine: Secondary | ICD-10-CM | POA: Diagnosis not present

## 2021-01-05 DIAGNOSIS — R293 Abnormal posture: Secondary | ICD-10-CM | POA: Diagnosis not present

## 2021-01-05 DIAGNOSIS — M50322 Other cervical disc degeneration at C5-C6 level: Secondary | ICD-10-CM | POA: Diagnosis not present

## 2021-01-05 DIAGNOSIS — M5383 Other specified dorsopathies, cervicothoracic region: Secondary | ICD-10-CM | POA: Diagnosis not present

## 2021-01-05 DIAGNOSIS — M9902 Segmental and somatic dysfunction of thoracic region: Secondary | ICD-10-CM | POA: Diagnosis not present

## 2021-01-05 DIAGNOSIS — M9901 Segmental and somatic dysfunction of cervical region: Secondary | ICD-10-CM | POA: Diagnosis not present

## 2021-01-05 DIAGNOSIS — M9903 Segmental and somatic dysfunction of lumbar region: Secondary | ICD-10-CM | POA: Diagnosis not present

## 2021-01-06 DIAGNOSIS — L299 Pruritus, unspecified: Secondary | ICD-10-CM | POA: Diagnosis not present

## 2021-01-06 DIAGNOSIS — L309 Dermatitis, unspecified: Secondary | ICD-10-CM | POA: Diagnosis not present

## 2021-01-09 DIAGNOSIS — L0390 Cellulitis, unspecified: Secondary | ICD-10-CM | POA: Diagnosis not present

## 2021-01-10 DIAGNOSIS — E039 Hypothyroidism, unspecified: Secondary | ICD-10-CM | POA: Diagnosis not present

## 2021-01-10 DIAGNOSIS — I714 Abdominal aortic aneurysm, without rupture: Secondary | ICD-10-CM | POA: Diagnosis not present

## 2021-01-10 DIAGNOSIS — E785 Hyperlipidemia, unspecified: Secondary | ICD-10-CM | POA: Diagnosis not present

## 2021-01-10 DIAGNOSIS — E669 Obesity, unspecified: Secondary | ICD-10-CM | POA: Diagnosis not present

## 2021-01-10 DIAGNOSIS — G4733 Obstructive sleep apnea (adult) (pediatric): Secondary | ICD-10-CM | POA: Diagnosis not present

## 2021-01-10 DIAGNOSIS — K7460 Unspecified cirrhosis of liver: Secondary | ICD-10-CM | POA: Diagnosis not present

## 2021-01-10 DIAGNOSIS — L03211 Cellulitis of face: Secondary | ICD-10-CM | POA: Diagnosis not present

## 2021-01-10 DIAGNOSIS — R69 Illness, unspecified: Secondary | ICD-10-CM | POA: Diagnosis not present

## 2021-01-10 DIAGNOSIS — M159 Polyosteoarthritis, unspecified: Secondary | ICD-10-CM | POA: Diagnosis not present

## 2021-01-14 DIAGNOSIS — M791 Myalgia, unspecified site: Secondary | ICD-10-CM | POA: Diagnosis not present

## 2021-01-14 DIAGNOSIS — M47812 Spondylosis without myelopathy or radiculopathy, cervical region: Secondary | ICD-10-CM | POA: Diagnosis not present

## 2021-01-18 DIAGNOSIS — R69 Illness, unspecified: Secondary | ICD-10-CM | POA: Diagnosis not present

## 2021-01-18 DIAGNOSIS — I714 Abdominal aortic aneurysm, without rupture: Secondary | ICD-10-CM | POA: Diagnosis not present

## 2021-01-18 DIAGNOSIS — L03211 Cellulitis of face: Secondary | ICD-10-CM | POA: Diagnosis not present

## 2021-01-18 DIAGNOSIS — K7460 Unspecified cirrhosis of liver: Secondary | ICD-10-CM | POA: Diagnosis not present

## 2021-01-18 DIAGNOSIS — M159 Polyosteoarthritis, unspecified: Secondary | ICD-10-CM | POA: Diagnosis not present

## 2021-01-18 DIAGNOSIS — E785 Hyperlipidemia, unspecified: Secondary | ICD-10-CM | POA: Diagnosis not present

## 2021-01-18 DIAGNOSIS — E039 Hypothyroidism, unspecified: Secondary | ICD-10-CM | POA: Diagnosis not present

## 2021-01-18 DIAGNOSIS — E669 Obesity, unspecified: Secondary | ICD-10-CM | POA: Diagnosis not present

## 2021-01-18 DIAGNOSIS — G4733 Obstructive sleep apnea (adult) (pediatric): Secondary | ICD-10-CM | POA: Diagnosis not present

## 2021-01-24 DIAGNOSIS — I714 Abdominal aortic aneurysm, without rupture: Secondary | ICD-10-CM | POA: Diagnosis not present

## 2021-01-24 DIAGNOSIS — E039 Hypothyroidism, unspecified: Secondary | ICD-10-CM | POA: Diagnosis not present

## 2021-01-24 DIAGNOSIS — K5900 Constipation, unspecified: Secondary | ICD-10-CM | POA: Diagnosis not present

## 2021-01-24 DIAGNOSIS — M159 Polyosteoarthritis, unspecified: Secondary | ICD-10-CM | POA: Diagnosis not present

## 2021-01-24 DIAGNOSIS — K760 Fatty (change of) liver, not elsewhere classified: Secondary | ICD-10-CM | POA: Diagnosis not present

## 2021-01-24 DIAGNOSIS — E785 Hyperlipidemia, unspecified: Secondary | ICD-10-CM | POA: Diagnosis not present

## 2021-01-24 DIAGNOSIS — E669 Obesity, unspecified: Secondary | ICD-10-CM | POA: Diagnosis not present

## 2021-01-24 DIAGNOSIS — G4733 Obstructive sleep apnea (adult) (pediatric): Secondary | ICD-10-CM | POA: Diagnosis not present

## 2021-01-24 DIAGNOSIS — R69 Illness, unspecified: Secondary | ICD-10-CM | POA: Diagnosis not present

## 2021-01-24 DIAGNOSIS — D509 Iron deficiency anemia, unspecified: Secondary | ICD-10-CM | POA: Diagnosis not present

## 2021-01-24 DIAGNOSIS — L03211 Cellulitis of face: Secondary | ICD-10-CM | POA: Diagnosis not present

## 2021-01-24 DIAGNOSIS — K7460 Unspecified cirrhosis of liver: Secondary | ICD-10-CM | POA: Diagnosis not present

## 2021-01-31 DIAGNOSIS — G4733 Obstructive sleep apnea (adult) (pediatric): Secondary | ICD-10-CM | POA: Diagnosis not present

## 2021-01-31 DIAGNOSIS — I714 Abdominal aortic aneurysm, without rupture: Secondary | ICD-10-CM | POA: Diagnosis not present

## 2021-01-31 DIAGNOSIS — E785 Hyperlipidemia, unspecified: Secondary | ICD-10-CM | POA: Diagnosis not present

## 2021-01-31 DIAGNOSIS — M159 Polyosteoarthritis, unspecified: Secondary | ICD-10-CM | POA: Diagnosis not present

## 2021-01-31 DIAGNOSIS — I10 Essential (primary) hypertension: Secondary | ICD-10-CM | POA: Diagnosis not present

## 2021-01-31 DIAGNOSIS — E039 Hypothyroidism, unspecified: Secondary | ICD-10-CM | POA: Diagnosis not present

## 2021-01-31 DIAGNOSIS — R69 Illness, unspecified: Secondary | ICD-10-CM | POA: Diagnosis not present

## 2021-01-31 DIAGNOSIS — K7460 Unspecified cirrhosis of liver: Secondary | ICD-10-CM | POA: Diagnosis not present

## 2021-01-31 DIAGNOSIS — E669 Obesity, unspecified: Secondary | ICD-10-CM | POA: Diagnosis not present

## 2021-02-04 DIAGNOSIS — F419 Anxiety disorder, unspecified: Secondary | ICD-10-CM | POA: Diagnosis not present

## 2021-02-04 DIAGNOSIS — Z79899 Other long term (current) drug therapy: Secondary | ICD-10-CM | POA: Diagnosis not present

## 2021-02-04 DIAGNOSIS — R69 Illness, unspecified: Secondary | ICD-10-CM | POA: Diagnosis not present

## 2021-02-08 DIAGNOSIS — H2513 Age-related nuclear cataract, bilateral: Secondary | ICD-10-CM | POA: Diagnosis not present

## 2021-02-08 DIAGNOSIS — H2703 Aphakia, bilateral: Secondary | ICD-10-CM | POA: Diagnosis not present

## 2021-02-23 DIAGNOSIS — L820 Inflamed seborrheic keratosis: Secondary | ICD-10-CM | POA: Diagnosis not present

## 2021-02-23 DIAGNOSIS — D225 Melanocytic nevi of trunk: Secondary | ICD-10-CM | POA: Diagnosis not present

## 2021-02-28 DIAGNOSIS — K7460 Unspecified cirrhosis of liver: Secondary | ICD-10-CM | POA: Diagnosis not present

## 2021-02-28 DIAGNOSIS — I10 Essential (primary) hypertension: Secondary | ICD-10-CM | POA: Diagnosis not present

## 2021-02-28 DIAGNOSIS — M159 Polyosteoarthritis, unspecified: Secondary | ICD-10-CM | POA: Diagnosis not present

## 2021-02-28 DIAGNOSIS — D509 Iron deficiency anemia, unspecified: Secondary | ICD-10-CM | POA: Diagnosis not present

## 2021-02-28 DIAGNOSIS — R69 Illness, unspecified: Secondary | ICD-10-CM | POA: Diagnosis not present

## 2021-02-28 DIAGNOSIS — E039 Hypothyroidism, unspecified: Secondary | ICD-10-CM | POA: Diagnosis not present

## 2021-02-28 DIAGNOSIS — E785 Hyperlipidemia, unspecified: Secondary | ICD-10-CM | POA: Diagnosis not present

## 2021-02-28 DIAGNOSIS — G4733 Obstructive sleep apnea (adult) (pediatric): Secondary | ICD-10-CM | POA: Diagnosis not present

## 2021-02-28 DIAGNOSIS — I714 Abdominal aortic aneurysm, without rupture: Secondary | ICD-10-CM | POA: Diagnosis not present

## 2021-03-01 DIAGNOSIS — M47812 Spondylosis without myelopathy or radiculopathy, cervical region: Secondary | ICD-10-CM | POA: Diagnosis not present

## 2021-03-18 DIAGNOSIS — I10 Essential (primary) hypertension: Secondary | ICD-10-CM | POA: Diagnosis not present

## 2021-03-18 DIAGNOSIS — E782 Mixed hyperlipidemia: Secondary | ICD-10-CM | POA: Diagnosis not present

## 2021-03-18 DIAGNOSIS — I2510 Atherosclerotic heart disease of native coronary artery without angina pectoris: Secondary | ICD-10-CM | POA: Diagnosis not present

## 2021-03-18 DIAGNOSIS — Z9989 Dependence on other enabling machines and devices: Secondary | ICD-10-CM | POA: Diagnosis not present

## 2021-03-18 DIAGNOSIS — I77810 Thoracic aortic ectasia: Secondary | ICD-10-CM | POA: Diagnosis not present

## 2021-03-18 DIAGNOSIS — G4733 Obstructive sleep apnea (adult) (pediatric): Secondary | ICD-10-CM | POA: Diagnosis not present

## 2021-03-28 DIAGNOSIS — R69 Illness, unspecified: Secondary | ICD-10-CM | POA: Diagnosis not present

## 2021-03-28 DIAGNOSIS — I10 Essential (primary) hypertension: Secondary | ICD-10-CM | POA: Diagnosis not present

## 2021-03-28 DIAGNOSIS — I714 Abdominal aortic aneurysm, without rupture: Secondary | ICD-10-CM | POA: Diagnosis not present

## 2021-03-28 DIAGNOSIS — D509 Iron deficiency anemia, unspecified: Secondary | ICD-10-CM | POA: Diagnosis not present

## 2021-03-28 DIAGNOSIS — E039 Hypothyroidism, unspecified: Secondary | ICD-10-CM | POA: Diagnosis not present

## 2021-03-28 DIAGNOSIS — E785 Hyperlipidemia, unspecified: Secondary | ICD-10-CM | POA: Diagnosis not present

## 2021-03-28 DIAGNOSIS — M159 Polyosteoarthritis, unspecified: Secondary | ICD-10-CM | POA: Diagnosis not present

## 2021-03-28 DIAGNOSIS — K7460 Unspecified cirrhosis of liver: Secondary | ICD-10-CM | POA: Diagnosis not present

## 2021-03-28 DIAGNOSIS — G4733 Obstructive sleep apnea (adult) (pediatric): Secondary | ICD-10-CM | POA: Diagnosis not present

## 2021-04-22 ENCOUNTER — Other Ambulatory Visit: Payer: Self-pay | Admitting: *Deleted

## 2021-04-22 NOTE — Patient Outreach (Signed)
[place] [facility]) Care Management  04/22/2021  Joshua Griffin [birth year] [id number]   Joshua Griffin received telephone call from patient.  [employer] compliance verified. RN discussed the services that [facility] provides.  He declined the services that [employer] provides. RN made him aware that she would send educational material on his disease process.  Plan: RN sent A Matter of Choice Blood Pressure Control Booklet RN will follow up and send out additional information in the month of December  [facility] [place] Management [phone]

## 2021-04-22 NOTE — Patient Outreach (Signed)
[place] [facility]) Care Management  04/22/2021  Fredis Malkiewicz 1960-11-26 [id number]   K[facility] attempted  outreach screening call to patient.  Patient was unavailable. HIPPA compliance voicemail message left with return callback number.  Plan: RN will call patient again within 10 days.  [place] [facility] Management [phone]

## 2021-05-02 DIAGNOSIS — R69 Illness, unspecified: Secondary | ICD-10-CM | POA: Diagnosis not present

## 2021-05-02 DIAGNOSIS — Z79899 Other long term (current) drug therapy: Secondary | ICD-10-CM | POA: Diagnosis not present

## 2021-05-02 DIAGNOSIS — F419 Anxiety disorder, unspecified: Secondary | ICD-10-CM | POA: Diagnosis not present

## 2021-05-30 DIAGNOSIS — R69 Illness, unspecified: Secondary | ICD-10-CM | POA: Diagnosis not present

## 2021-05-30 DIAGNOSIS — Z79899 Other long term (current) drug therapy: Secondary | ICD-10-CM | POA: Diagnosis not present

## 2021-05-30 DIAGNOSIS — F419 Anxiety disorder, unspecified: Secondary | ICD-10-CM | POA: Diagnosis not present

## 2021-05-31 DIAGNOSIS — Z130 Encounter for screening for diseases of the blood and blood-forming organs and certain disorders involving the immune mechanism: Secondary | ICD-10-CM | POA: Diagnosis not present

## 2021-05-31 DIAGNOSIS — Z125 Encounter for screening for malignant neoplasm of prostate: Secondary | ICD-10-CM | POA: Diagnosis not present

## 2021-05-31 DIAGNOSIS — Z1329 Encounter for screening for other suspected endocrine disorder: Secondary | ICD-10-CM | POA: Diagnosis not present

## 2021-05-31 DIAGNOSIS — Z0000 Encounter for general adult medical examination without abnormal findings: Secondary | ICD-10-CM | POA: Diagnosis not present

## 2021-05-31 DIAGNOSIS — Z13220 Encounter for screening for lipoid disorders: Secondary | ICD-10-CM | POA: Diagnosis not present

## 2021-06-22 DIAGNOSIS — I10 Essential (primary) hypertension: Secondary | ICD-10-CM | POA: Diagnosis not present

## 2021-06-22 DIAGNOSIS — M25522 Pain in left elbow: Secondary | ICD-10-CM | POA: Diagnosis not present

## 2021-06-22 DIAGNOSIS — S5002XA Contusion of left elbow, initial encounter: Secondary | ICD-10-CM | POA: Diagnosis not present

## 2021-06-22 DIAGNOSIS — E785 Hyperlipidemia, unspecified: Secondary | ICD-10-CM | POA: Diagnosis not present

## 2021-07-01 DIAGNOSIS — E039 Hypothyroidism, unspecified: Secondary | ICD-10-CM | POA: Diagnosis not present

## 2021-07-01 DIAGNOSIS — E782 Mixed hyperlipidemia: Secondary | ICD-10-CM | POA: Diagnosis not present

## 2021-07-04 IMAGING — CR DG CHEST 2V
2 series · 2 of 2 positions shown · non-contrast
Comparison: 10/13/2020

CLINICAL DATA: [id number] positivity with shortness of breath

EXAM:
CHEST - 2 VIEW

[w chest pa]
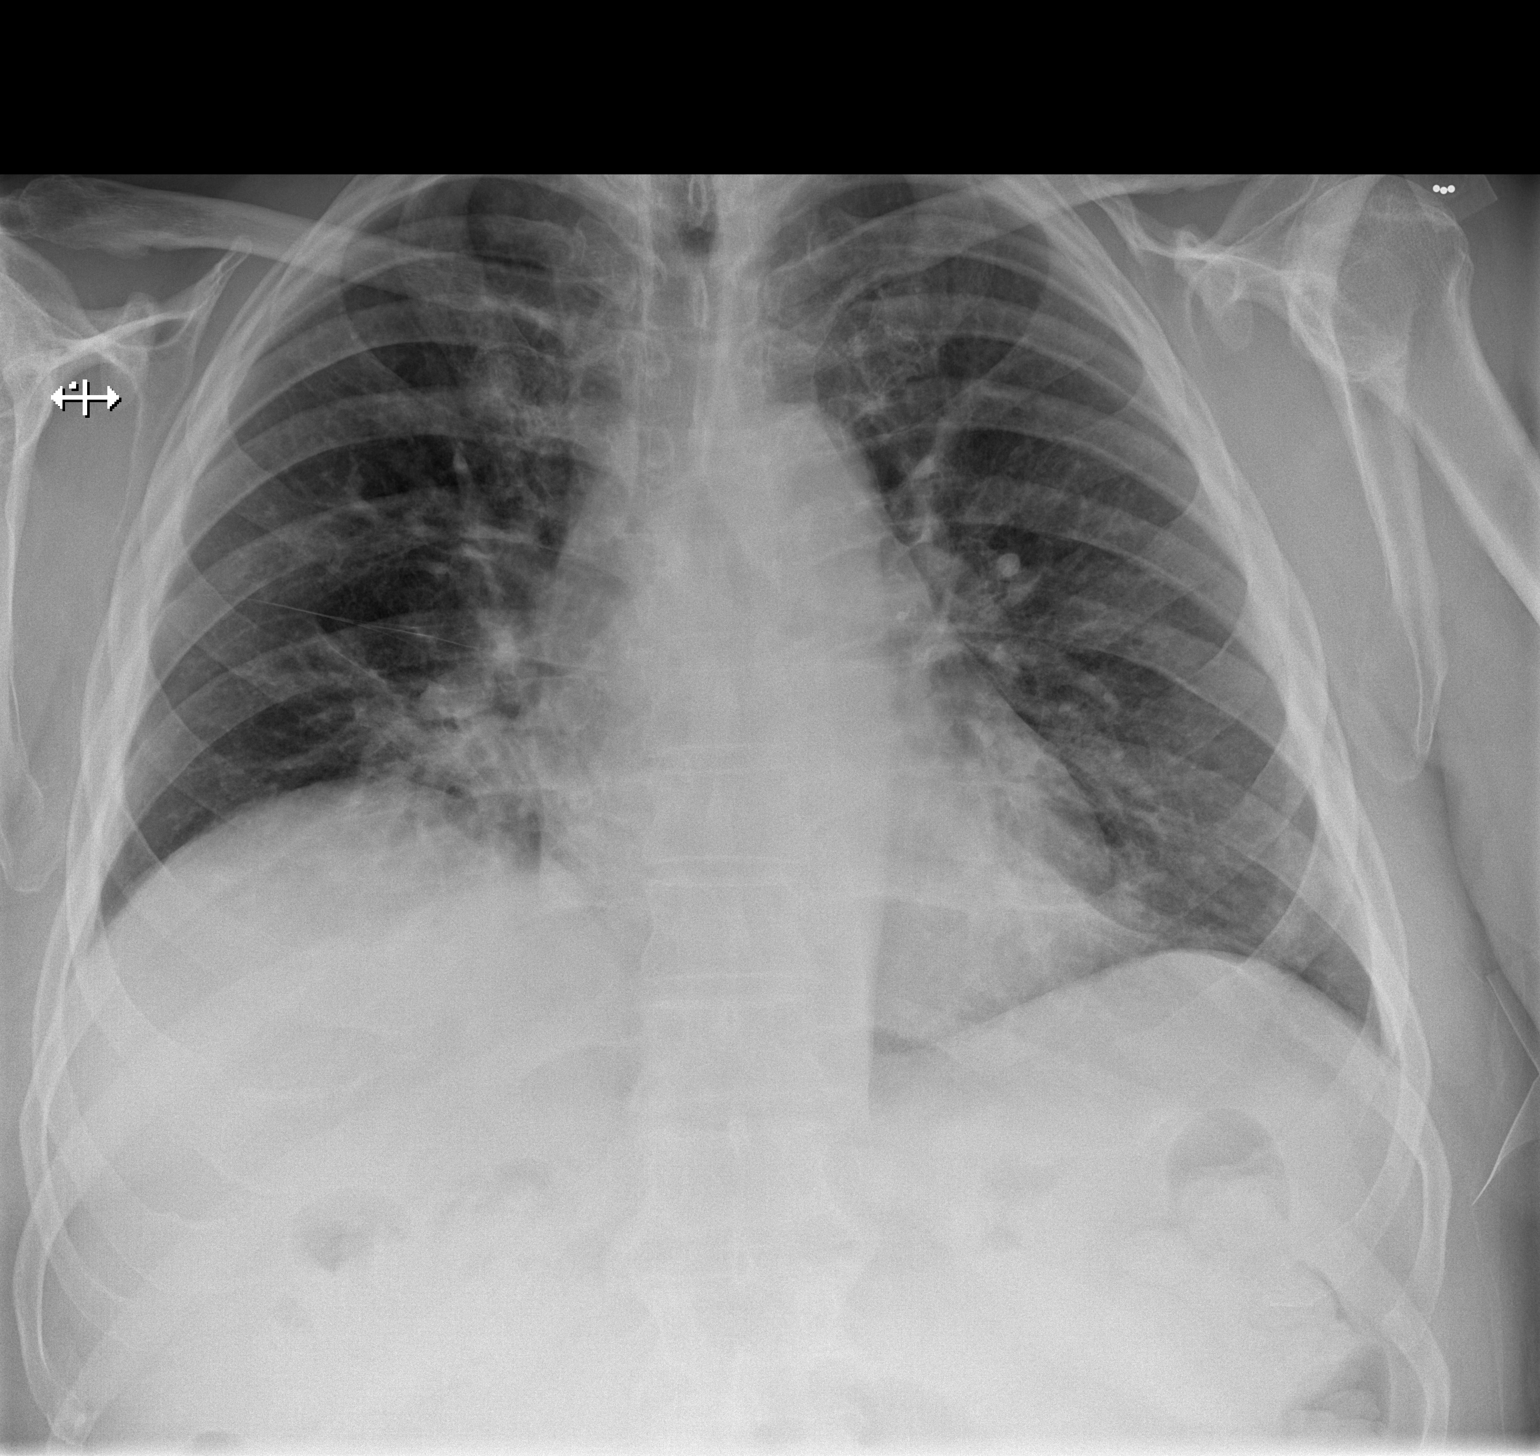

[w chest lat]
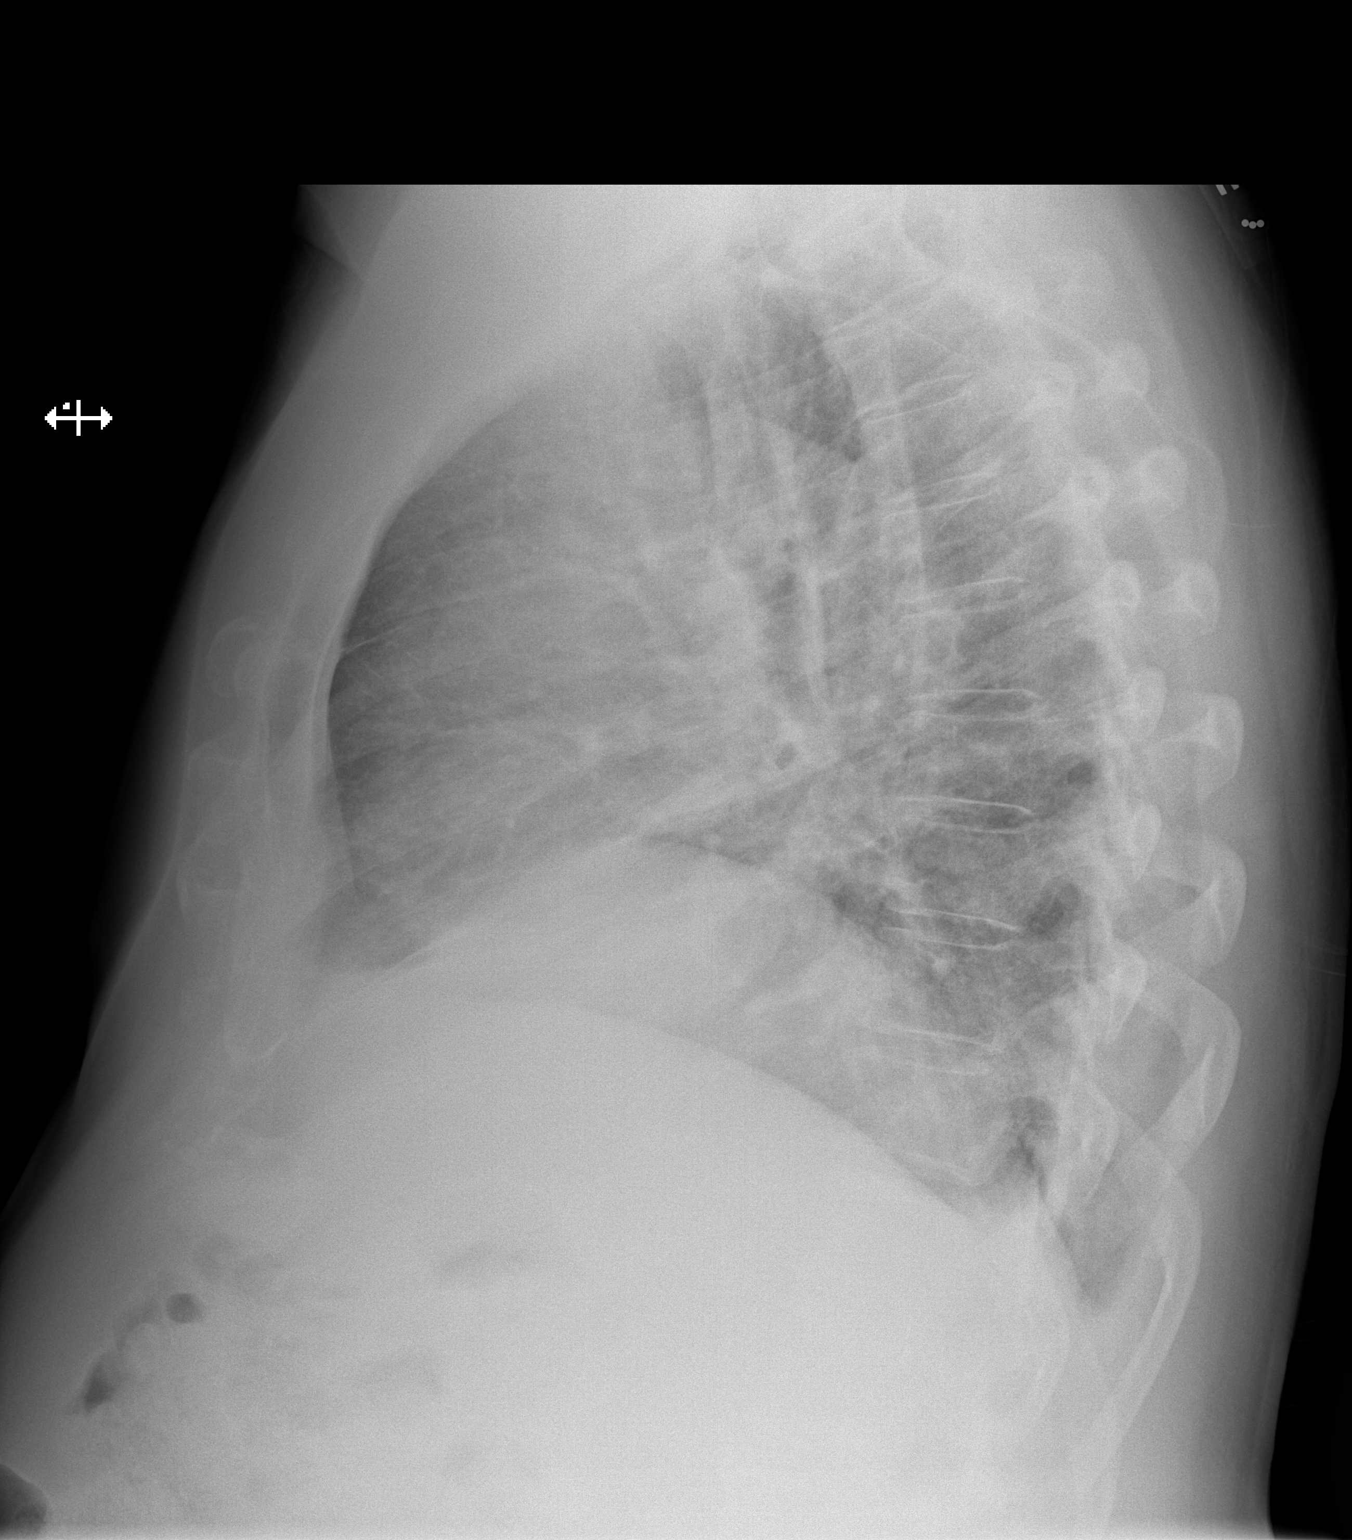

[2 of 2 positions shown; findings below may reference images not displayed]

FINDINGS: Cardiac shadow is stable. The lungs are well aerated bilaterally.
Some improvement in the patchy airspace disease is noted when
compared with the prior CT examination. No new focal abnormality is
seen.
IMPRESSION: Mildly improved aeration when compare with the prior exam.

## 2021-07-13 DIAGNOSIS — M5442 Lumbago with sciatica, left side: Secondary | ICD-10-CM | POA: Diagnosis not present

## 2021-07-21 DIAGNOSIS — R109 Unspecified abdominal pain: Secondary | ICD-10-CM | POA: Diagnosis not present

## 2021-07-21 DIAGNOSIS — M545 Low back pain, unspecified: Secondary | ICD-10-CM | POA: Diagnosis not present

## 2021-07-22 DIAGNOSIS — M545 Low back pain, unspecified: Secondary | ICD-10-CM | POA: Diagnosis not present

## 2021-08-02 DIAGNOSIS — F419 Anxiety disorder, unspecified: Secondary | ICD-10-CM | POA: Diagnosis not present

## 2021-08-02 DIAGNOSIS — R69 Illness, unspecified: Secondary | ICD-10-CM | POA: Diagnosis not present

## 2021-08-02 DIAGNOSIS — Z79899 Other long term (current) drug therapy: Secondary | ICD-10-CM | POA: Diagnosis not present

## 2021-08-15 DIAGNOSIS — E039 Hypothyroidism, unspecified: Secondary | ICD-10-CM | POA: Diagnosis not present

## 2021-08-15 DIAGNOSIS — E782 Mixed hyperlipidemia: Secondary | ICD-10-CM | POA: Diagnosis not present

## 2021-09-06 DIAGNOSIS — Z981 Arthrodesis status: Secondary | ICD-10-CM | POA: Diagnosis not present

## 2021-09-06 DIAGNOSIS — M545 Low back pain, unspecified: Secondary | ICD-10-CM | POA: Diagnosis not present

## 2021-09-26 DIAGNOSIS — Z981 Arthrodesis status: Secondary | ICD-10-CM | POA: Diagnosis not present

## 2021-09-26 DIAGNOSIS — M542 Cervicalgia: Secondary | ICD-10-CM | POA: Diagnosis not present

## 2021-09-26 DIAGNOSIS — M47812 Spondylosis without myelopathy or radiculopathy, cervical region: Secondary | ICD-10-CM | POA: Diagnosis not present

## 2021-09-26 DIAGNOSIS — M25512 Pain in left shoulder: Secondary | ICD-10-CM | POA: Diagnosis not present

## 2021-09-26 DIAGNOSIS — M62838 Other muscle spasm: Secondary | ICD-10-CM | POA: Diagnosis not present

## 2021-10-04 ENCOUNTER — Other Ambulatory Visit: Payer: Self-pay | Admitting: *Deleted

## 2021-10-04 DIAGNOSIS — M25512 Pain in left shoulder: Secondary | ICD-10-CM | POA: Diagnosis not present

## 2021-10-04 DIAGNOSIS — M7582 Other shoulder lesions, left shoulder: Secondary | ICD-10-CM | POA: Diagnosis not present

## 2021-10-04 DIAGNOSIS — M19012 Primary osteoarthritis, left shoulder: Secondary | ICD-10-CM | POA: Diagnosis not present

## 2021-10-04 NOTE — Patient Outreach (Signed)
[place] [facility]) Care Management  10/04/2021  Zakariyah Freimark 06/13/1961 [id number]   RN provided six month educational mailing.   Plan: RN sent Heart Healthy Eating Plan RN sent Managing Hypertension RN sent F.A.S.T. acronym sheet for stroke RN will follow up within the month of June  [facility] [place] [facility] [phone]

## 2021-10-10 ENCOUNTER — Encounter: Payer: Self-pay | Admitting: *Deleted

## 2021-10-13 NOTE — Telephone Encounter (Signed)
This encounter was created in error - please disregard.

## 2022-04-04 ENCOUNTER — Other Ambulatory Visit: Payer: Self-pay | Admitting: *Deleted

## 2022-04-05 NOTE — Patient Outreach (Addendum)
[place] [facility]) Care Management  04/05/2022  Bertram Haddix 02-Dec-1960 [id number]   [place] closed patient's case and sent patient a case closed letter due to [facility] has discontinued the educational mailing program.   Plan: RN sent patient a letter.  [place] Care Management [phone]
